# Patient Record
Sex: Male | Born: 1960
Health system: Southern US, Community
[De-identification: ages and names within clinical notes are randomized; demographics above are authoritative.]

## PROBLEM LIST (undated history)

## (undated) DIAGNOSIS — K219 Gastro-esophageal reflux disease without esophagitis: Secondary | ICD-10-CM

## (undated) DIAGNOSIS — C801 Malignant (primary) neoplasm, unspecified: Secondary | ICD-10-CM

## (undated) DIAGNOSIS — E785 Hyperlipidemia, unspecified: Secondary | ICD-10-CM

## (undated) DIAGNOSIS — I10 Essential (primary) hypertension: Secondary | ICD-10-CM

## (undated) DIAGNOSIS — K56609 Unspecified intestinal obstruction, unspecified as to partial versus complete obstruction: Secondary | ICD-10-CM

## (undated) DIAGNOSIS — E119 Type 2 diabetes mellitus without complications: Secondary | ICD-10-CM

## (undated) DIAGNOSIS — U071 COVID-19: Secondary | ICD-10-CM

## (undated) DIAGNOSIS — M7580 Other shoulder lesions, unspecified shoulder: Secondary | ICD-10-CM

## (undated) HISTORY — DX: Unspecified intestinal obstruction, unspecified as to partial versus complete obstruction: K56.609

## (undated) HISTORY — PX: FOOT SURGERY: SHX648

## (undated) HISTORY — PX: HERNIA REPAIR: SHX51

## (undated) HISTORY — DX: Other shoulder lesions, unspecified shoulder: M75.80

## (undated) HISTORY — DX: Gastro-esophageal reflux disease without esophagitis: K21.9

## (undated) HISTORY — PX: OTHER SURGICAL HISTORY: SHX169

## (undated) HISTORY — DX: Hyperlipidemia, unspecified: E78.5

## (undated) HISTORY — PX: TONSILLECTOMY: SUR1361

---

## 2005-11-10 HISTORY — PX: COLONOSCOPY: SHX174

## 2006-09-22 ENCOUNTER — Inpatient Hospital Stay (HOSPITAL_COMMUNITY): Admission: EM | Admit: 2006-09-22 | Discharge: 2006-09-24 | Payer: Self-pay | Admitting: Emergency Medicine

## 2006-09-22 ENCOUNTER — Ambulatory Visit: Payer: Self-pay | Admitting: Internal Medicine

## 2006-10-12 ENCOUNTER — Ambulatory Visit: Payer: Self-pay | Admitting: Internal Medicine

## 2006-10-12 ENCOUNTER — Ambulatory Visit (HOSPITAL_COMMUNITY): Admission: RE | Admit: 2006-10-12 | Discharge: 2006-10-12 | Payer: Self-pay | Admitting: Internal Medicine

## 2006-10-12 HISTORY — PX: GIVENS CAPSULE STUDY: SHX5432

## 2007-05-24 ENCOUNTER — Ambulatory Visit (HOSPITAL_COMMUNITY): Admission: RE | Admit: 2007-05-24 | Discharge: 2007-05-24 | Payer: Self-pay | Admitting: Family Medicine

## 2011-03-28 NOTE — H&P (Signed)
NAMEROLF, FELLS NO.:  [id number]   MEDICAL RECORD NO.:  [id number]          PATIENT TYPE:  INP   LOCATION:  A320                          FACILITY:  [facility]   PHYSICIAN:  Barbaraann Barthel, M.D. DATE OF BIRTH:  1961-10-27   DATE OF ADMISSION:  09/22/2006  DATE OF DISCHARGE:  LH                                HISTORY & PHYSICAL   NOTE:  Surgery was asked to see this 50 year old white male for abdominal  pain.  He was seen in the emergency room, where a CT scan was performed and  revealed partial small-bowel obstruction; Surgery was consulted.   CHIEF COMPLAINT:  Crampy abdominal epigastric pain.   HISTORY OF PRESENT MEDICAL ILLNESS:  The patient states that he was in his  normal state of good health over the weekend.  He developed some pain around  11 o'clock at night on Monday evening; this was after eating some spaghetti  and ice cream.  This was accompanied with bloating, crampy abdominal pain  and bouts of watery stool.  He also had some nausea and vomiting.  He, in  the past, last February and a couple of months ago, also had similar  symptoms which were treated non operatively.  He had a GI consult apparently  at [facility], where he was told that he had Crohn's disease; how  this was worked up I do not know.  He was treated for Crohn's disease with  some steroids and improved somewhat, although the steroids caused him to  have some urinary difficulty.  He states that the episodes that necessitated  his admission now were similar to those types of pain that he had had on 2  previous episodes when he was admitted for partial small-bowel obstruction.   PHYSICAL EXAMINATION:  GENERAL:  Physical examination discloses a pleasant  50 year old white male.  He is 5 feet 11 and weighs 250 pounds.  VITAL SIGNS:  Temperature is 97.6, pulse rate is 93 per minute, blood  pressure 139/106, respirations 20 per minute.  O2 SAT was 96%.  Head:  Normocephalic.  EYES:  Extraocular movements are intact.  Pupils are round and react to  light and accommodation.  The sclerae are of normal tincture.  The patient  uses corrective lenses.  ENT:  Nasal and oral mucosa are moist.  NECK:  Supple and cylindrical without jugular vein distension, thyromegaly  or tracheal deviation.  The patient has no cervical adenopathy and no bruits  are auscultated.  CHEST:  Clear both to anterior and posterior auscultation.  HEART:  Regular rhythm.  ABDOMEN:  The patient is tender primarily in the epigastrium, but I also  note some tenderness in the right upper quadrant.  Bowel sounds are somewhat  diminished.  There are no femoral or inguinal hernias appreciated.  The  patient stated that he had a left inguinal hernia repair during infancy.  There is no recurrent hernia there.  GENITALIA:  Grossly within normal limits.  RECTAL:  Examination was performed in the emergency room by the PA; this was  reported as guaiac-negative.  This was not repeated.  EXTREMITIES:  Grossly within normal limits.  The patient has had previous  bone spur operation on the left foot on his heel.   REVIEW OF SYSTEMS:  GI SYSTEM:  The patient has had 2 previous episodes of  partial small-bowel obstruction.  He has been told that he has had Crohn's  disease and has been treated for this in the past.  He has no past history  of hepatitis.  He has been worked up for gallbladder disease in the past and  this has been negative.  CT scan reveals a partial small-bowel obstruction.  The patient has had nausea and vomiting and multiple bouts of liquid stool  without bright red rectal bleeding or black tarry stools.  GU SYSTEM:  No  dysuria or complaints of frequency.  No past history of nephrolithiasis.  ENDOCRINE SYSTEM:  No past history of diabetes or thyroid disease.  CARDIOVASCULAR SYSTEM:  The patient has a history of hypertension for which  he takes Caduet 10/40 one-half tablet daily and he sees  Dr. Regino Schultze  regarding this.  MUSCULOSKELETAL SYSTEM:  The patient is somewhat obese.   MEDICATIONS:  1. The patient takes Zegerid one tablet daily for GERD.  2. Caduet 10/40 a half tablet daily.   ALLERGIES:  He is allergic to PENICILLIN.   LABORATORY DATA:  The patient was admitted with a white count 22.3 with an  H&H of 17.7 and 52.5 with 94 neutrophils noted.  His electrolytes are  grossly within normal limits.  His blood sugar is 192.  His BUN is 19.  His  creatinine is 1.9.  His lipase is not elevated at 16.  Urinalysis reveals no  proteinuria and no glucosuria.  The specific gravity shows  hyperconcentration.   IMAGING STUDY:  CT scan results as mentioned above.   IMPRESSION:  1. Therefore, Mr. Doughten is admitted with a diagnosis of partial small-bowel      obstruction.  He has a history according to him as worked up by the      Gastroenterology service in [place] as Crohn's disease.  2. He has a history of hypertension.  3. A history of reflux and gastroesophageal reflux disease.   Impression therefore, partial small-bowel obstruction.  This may be due to  an ileus related to Crohn's disease.  He has right upper quadrant pain which  I will review with a sonogram in the morning to rule out gallbladder  disease.  He is dehydrated and hemoconcentrated at present.  We will continue to  hydrate him.  I will have Dr. Edison Simon service follow him medically and  also obtain a Gastroenterology consult as well.      Barbaraann Barthel, M.D.  Electronically Signed     WB/MEDQ  D:  09/22/2006  T:  09/23/2006  Job:  [id number]   cc:   Kirk Ruths, M.D.  Fax: [phone]

## 2011-03-28 NOTE — Consult Note (Signed)
[name], [name]                 ACCOUNT NO.:  [id number]   MEDICAL RECORD NO.:  [id number]          PATIENT TYPE:  INP   LOCATION:  A320                          FACILITY:  [facility]   PHYSICIAN:  R. Roetta Sessions, M.D. DATE OF BIRTH:  [birth year]   DATE OF CONSULTATION:  09/23/2006  DATE OF DISCHARGE:                                   CONSULTATION   REASON FOR CONSULTATION:  1. Partial small bowel obstruction.  2. Questionable history of Crohn's disease.   Physician requesting consultation, Barbaraann Barthel, M.D.  Physician cosigning note, Roetta Sessions.   HISTORY OF PRESENT ILLNESS:  The patient is a 50 year old Caucasian  gentleman who was admitted with partial small bowel obstruction.  This is  his third episode since February of this year.  He has been hospitalized on  2 other occasions in [place], [place].  In April, he had a CT, which  suggested partial small bowel obstruction and questionable abnormal terminal  ileum, possibly Crohn's disease.  He underwent colonoscopy with several cm  of the terminal ileum seen by Dr. __________.  He had a sigmoid colon polyp  and terminal ileum lymphoid hypoplasia but otherwise, no evidence of Crohn's  disease.  I have not seen a biopsy report.  He had a small bowel follow  through, which was normal.  He said he was treated with Entocort for several  months but, per the patient, was told that they are not sure if he actually  has Crohn's disease.  The patient feels well in between these episodes.  He  has regular bowel movements, no diarrhea, melena or rectal bleeding, nausea  or vomiting or abdominal pain.  He has chronic GERD, which is well  controlled.  He woke up in the middle of the night with severe abdominal  pain.  He describes abdominal bloating, cramping, vomiting and diarrhea.  Denies any fever and chills.   MEDICATIONS AT HOME:  1. Zegrid 40 mg daily.  2. Caduet 10/40 mg 1/2 tablet daily.  He rarely takes ibuprofen.   ALLERGIES:  PENICILLIN.   PAST MEDICAL HISTORY:  1. History of recurrent partial small bowel obstruction as outlined above.  2. Gastroesophageal reflux disease.  3. Hypertension.  4. Hyperlipidemia.  5. He has had left foot bone spurs.   PAST SURGICAL HISTORY:  1. Tonsillectomy.  2. Left inguinal hernia repair as a child.   FAMILY HISTORY:  Negative for Crohn's and GI illnesses, colorectal cancer,  IBD.   SOCIAL HISTORY:  He is married and has 3 children.  He works for [employer].  No tobacco or alcohol use.   REVIEW OF SYSTEMS:  See HPI for GI.  CONSTITUTIONAL:  No weight loss.  CARDIOPULMONARY:  No chest pain or  shortness of breath.   PHYSICAL EXAMINATION:  VITAL SIGNS:  Temperature: 98.6.  Pulse:  88.  Respirations:  20.  Blood pressure:  133/79.  Weight:  113.2 kg.  Height:  71 inches.  GENERAL:  Pleasant, obese Caucasian male in no acute distress.  SKIN:  Warm and dry, no jaundice.  HEENT:  Sclerae nonicteric.  __________ and moist and pink.  NG tube in  place with clear, yellow secretions.  CHEST:  Clear to auscultation.  CARDIAC:  Regular rate and rhythm.  Normal S1, S2.  No murmurs, rubs or  gallops.  ABDOMEN:  Positive bowel sounds.  Soft, nontender, nondistended.  No  organomegaly or masses.  No rebound, tenderness or guarding.  EXTREMITIES:  No edema.   LABS:  Initially his white count was 22,300, it is 14,200 today, hemoglobin  14.6, hematocrit 43.9, platelets 380,000, sodium 138, potassium 3.7, BUN 27,  creatinine 1.9, down to 1.2 today, glucose on admission was 192, it is 129  today, lipase is 16.  CT without contrast revealed dilated fluid filled  loops of small bowel.   IMPRESSION:  Recurrent partial small bowel obstruction, unclear etiology.  The patient apparently on previous CT, had questionable inflammatory changes  at the terminal ileum and there was questionable Crohn's disease but  reportedly, endoscopically, his TI looks normal and small  bowel follow  through has subsequently been normal.   RECOMMENDATIONS:  Will discuss further with Dr. Cameron Ali regarding any further  workup.  Follow up on abdominal ultrasound when available.  Add LFTs to  labs.      Tana Coast, P.AJonathon Bellows, M.D.  Electronically Signed    LL/MEDQ  D:  09/23/2006  T:  09/23/2006  Job:  [id number]   cc:   Barbaraann Barthel, M.D.  Fax: [phone]

## 2011-03-28 NOTE — Op Note (Signed)
[name], [name]                 ACCOUNT NO.:  [id number]   MEDICAL RECORD NO.:  [id number]          PATIENT TYPE:  AMB   LOCATION:  DAY                           FACILITY:  [facility]   PHYSICIAN:  R. Roetta Sessions, M.D. DATE OF BIRTH:  [birth year]   DATE OF PROCEDURE:  DATE OF DISCHARGE:  10/12/2006                               OPERATIVE REPORT   PROCEDURE:  Small bowel Givens capsule endoscopy.   PHYSICIAN REQUESTING PROCEDURE:  Jonathon Bellows, M.D.   INDICATIONS FOR PROCEDURE:  The patient is a 50 year old Caucasian male  who was admitted with an episode of acute abdominal pain and was found  to have a partial small bowel obstruction diagnosed by CT scan back on  September 22, 2006. A previous CT had shown questionable inflammatory  changes at the terminal ileum with questionable Crohn's disease but  endoscopically his terminal ileum looks normal and small bowel  followthrough had subsequently been normal. On colonoscopy and terminal  ileoscopy, he was found to have terminal ileal lymphoid hyperplasia but  otherwise there was no evidence of Crohn's disease. He is undergoing  Givens capsule study for further evaluation of his small bowel.   FINDINGS:  Gastric passage time was 9 minutes with first duodenal image  being at 9 minutes and 29 seconds. At 5 hours 23 minutes and 7 seconds,  he was found to have lymphoid hyperplasia which persisted throughout the  remainder of the small bowel to the terminal ileum at 5 hours 40  minutes. The first ileocecal image was at 5 hours 40 minutes and 52  seconds. The first cecal image was at 5 hours 51 minutes and 30 seconds.  Total small bowel transit time was 5 hours 42 minutes. There was no  evidence of active GI bleeding, inflammation or erosions on this study.   RECOMMENDATIONS:  Lymphoid hyperplasia of the distal small  bowel/terminal ileum. More than like this is what was seen on CT scan as  it has been confirmed with both small bowel  capsule study as well as  previous colonoscopy. There was no evidence of inflammatory bowel  disease. Lymphoid hyperplasia is a benign finding and no further workup  is required at this time.      Nicholas Lose, N.P.      Jonathon Bellows, M.D.  Electronically Signed    KC/MEDQ  D:  10/20/2006  T:  10/20/2006  Job:  [id number]   cc:   Kirk Ruths, M.D.  Fax: [phone]

## 2011-03-28 NOTE — Discharge Summary (Signed)
[name], [name] NO.:  [id number]   MEDICAL RECORD NO.:  [id number]          PATIENT TYPE:  INP   LOCATION:  A320                          FACILITY:  [facility]   PHYSICIAN:  Barbaraann Barthel, M.D. DATE OF BIRTH:  1961-01-08   DATE OF ADMISSION:  09/22/2006  DATE OF DISCHARGE:  11/15/2007LH                                 DISCHARGE SUMMARY   CONSULTATIONS:  1. To Dr. Regino Schultze.  2. To Dr. Jena Gauss of the GI service.   ADMISSION DIAGNOSES:  1. Partial small bowel obstruction.  2. History of Crohn's disease.   SECONDARY DIAGNOSES:  1. Hypertension.  2. History of gastroesophageal reflux.   NOTE:  This is a 50 year old white male who was admitted with an episode of  acute abdominal pain which began about 4 or 5 hours after eating spaghetti  and an ice cream, and he developed bloating.  He was admitted to the  emergency room, and a partial small bowel obstruction was diagnosed on CT  scan, and surgery was consulted.  In essence, an NG tube was placed, and he  immediately decompressed. He was passing flatus and having stools, after 24  hours after his NG tube was removed.  He had had no previous history of any  intra-abdominal surgery.  He had two prior episodes of similar circumstances  that came acutely after eating.  However, no intermittent episodes of  abdominal postprandial pain suggestive of ischemia.  The diagnosis of  Crohn's  disease is somewhat in doubt, as apparently had a normal small-  bowel follow-through at one point in the workup, and certainly nothing like  that was seen on CT scan.  I hypothesized that there is possibly some  dietary intolerance or allergy or some problems that are of that nature  rather than a mechanical problem due to Crohn's disease.  At any rate, the  gastroenterology service was consulted, and they essentially agreed that he  had no surgical problem as well, and the plan is for further GI workup as an  outpatient. where a  capsular study of the small intestine as planned.   His hospital course was completely unremarkable.  His blood pressures, etc.,  all remained within normal limits.  He remained afebrile and, as stated, he  improved immediately after a small time with NG decompression.   LABORATORY DATA:  The patient was admitted with a white count of 22.3 with  an H&H of 17.7 and 52.5, with 94 neutrophils. After hydration, his white  count was 14,000 with H&H of 14.6 and 43.9, with a normal differential.  His  electrolytes remained within normal limits.  His lipase was not elevated on  admission.  Also, his liver function studies all were grossly within normal  limits.  While admitted, since he had some tenderness in the right upper  quadrant, I did check a sonogram of his right upper quadrant, and this also  revealed no stones or any suggestion of gallbladder disease.   DISCHARGE INSTRUCTIONS:  The patient is discharged on a bland diet.  He is  told to refrain  from any milk products.  He is to follow up with Dr. Jena Gauss  for their planned outpatient workup.  He is to continue taking his  hypertensive medications and other medications as per Dr. Regino Schultze, and I  have made arrangements to follow up with him, and he is to return to the  emergency room should there be any acute problems.  The patient is  discharged on a bland diet.  His activity is to increase as tolerated.  Medications as stated above, to continue his hypertensive medications and  his acid reflux medications.  I will see him in three weeks' time.      Barbaraann Barthel, M.D.  Electronically Signed     WB/MEDQ  D:  09/24/2006  T:  09/24/2006  Job:  [id number]   cc:   [name], M.D.  Fax: [phone]   [name], M.D.  [address]  [place]  [place] [address]

## 2016-02-25 ENCOUNTER — Inpatient Hospital Stay (HOSPITAL_COMMUNITY)
Admission: EM | Admit: 2016-02-25 | Discharge: 2016-02-28 | DRG: 390 | Disposition: A | Discharge status: Home / Self Care | Payer: Commercial Managed Care - PPO | Attending: Internal Medicine | Admitting: Internal Medicine

## 2016-02-25 ENCOUNTER — Encounter (HOSPITAL_COMMUNITY): Payer: Self-pay | Admitting: Emergency Medicine

## 2016-02-25 ENCOUNTER — Emergency Department (HOSPITAL_COMMUNITY): Payer: Commercial Managed Care - PPO

## 2016-02-25 DIAGNOSIS — I10 Essential (primary) hypertension: Secondary | ICD-10-CM | POA: Diagnosis present

## 2016-02-25 DIAGNOSIS — K869 Disease of pancreas, unspecified: Secondary | ICD-10-CM | POA: Diagnosis present

## 2016-02-25 DIAGNOSIS — Z7984 Long term (current) use of oral hypoglycemic drugs: Secondary | ICD-10-CM | POA: Diagnosis not present

## 2016-02-25 DIAGNOSIS — Z823 Family history of stroke: Secondary | ICD-10-CM

## 2016-02-25 DIAGNOSIS — K567 Ileus, unspecified: Secondary | ICD-10-CM | POA: Diagnosis not present

## 2016-02-25 DIAGNOSIS — Z833 Family history of diabetes mellitus: Secondary | ICD-10-CM | POA: Diagnosis not present

## 2016-02-25 DIAGNOSIS — R112 Nausea with vomiting, unspecified: Secondary | ICD-10-CM

## 2016-02-25 DIAGNOSIS — K8689 Other specified diseases of pancreas: Secondary | ICD-10-CM | POA: Diagnosis present

## 2016-02-25 DIAGNOSIS — K560 Paralytic ileus: Principal | ICD-10-CM | POA: Diagnosis present

## 2016-02-25 DIAGNOSIS — Z7982 Long term (current) use of aspirin: Secondary | ICD-10-CM

## 2016-02-25 DIAGNOSIS — F17220 Nicotine dependence, chewing tobacco, uncomplicated: Secondary | ICD-10-CM | POA: Diagnosis present

## 2016-02-25 DIAGNOSIS — Z8249 Family history of ischemic heart disease and other diseases of the circulatory system: Secondary | ICD-10-CM

## 2016-02-25 DIAGNOSIS — E1165 Type 2 diabetes mellitus with hyperglycemia: Secondary | ICD-10-CM | POA: Diagnosis present

## 2016-02-25 DIAGNOSIS — R1110 Vomiting, unspecified: Secondary | ICD-10-CM | POA: Diagnosis not present

## 2016-02-25 DIAGNOSIS — E860 Dehydration: Secondary | ICD-10-CM | POA: Diagnosis present

## 2016-02-25 HISTORY — DX: Type 2 diabetes mellitus without complications: E11.9

## 2016-02-25 HISTORY — DX: Essential (primary) hypertension: I10

## 2016-02-25 LAB — CBC WITH DIFFERENTIAL/PLATELET
BASOS ABS: 0 10*3/uL (ref 0.0–0.1)
Basophils Relative: 0 %
EOS PCT: 0 %
Eosinophils Absolute: 0 10*3/uL (ref 0.0–0.7)
HEMATOCRIT: 47.5 % (ref 39.0–52.0)
Hemoglobin: 15.4 g/dL (ref 13.0–17.0)
LYMPHS ABS: 0.9 10*3/uL (ref 0.7–4.0)
LYMPHS PCT: 6 %
MCH: 27.8 pg (ref 26.0–34.0)
MCHC: 32.4 g/dL (ref 30.0–36.0)
MCV: 85.9 fL (ref 78.0–100.0)
MONO ABS: 1.6 10*3/uL — AB (ref 0.1–1.0)
Monocytes Relative: 10 %
NEUTROS ABS: 12.9 10*3/uL — AB (ref 1.7–7.7)
Neutrophils Relative %: 84 %
Platelets: 316 10*3/uL (ref 150–400)
RBC: 5.53 MIL/uL (ref 4.22–5.81)
RDW: 13 % (ref 11.5–15.5)
WBC: 15.4 10*3/uL — AB (ref 4.0–10.5)

## 2016-02-25 LAB — COMPREHENSIVE METABOLIC PANEL
ALBUMIN: 4.2 g/dL (ref 3.5–5.0)
ALT: 36 U/L (ref 17–63)
ANION GAP: 16 — AB (ref 5–15)
AST: 28 U/L (ref 15–41)
Alkaline Phosphatase: 56 U/L (ref 38–126)
BUN: 31 mg/dL — AB (ref 6–20)
CHLORIDE: 97 mmol/L — AB (ref 101–111)
CO2: 23 mmol/L (ref 22–32)
Calcium: 8.4 mg/dL — ABNORMAL LOW (ref 8.9–10.3)
Creatinine, Ser: 1.25 mg/dL — ABNORMAL HIGH (ref 0.61–1.24)
GFR calc Af Amer: >60 mL/min (ref >60)
GFR calc non Af Amer: >60 mL/min (ref >60)
GLUCOSE: 329 mg/dL — AB (ref 65–99)
POTASSIUM: 3.5 mmol/L (ref 3.5–5.1)
Sodium: 136 mmol/L (ref 135–145)
Total Bilirubin: 1.3 mg/dL — ABNORMAL HIGH (ref 0.3–1.2)
Total Protein: 8.1 g/dL (ref 6.5–8.1)

## 2016-02-25 LAB — GLUCOSE, CAPILLARY
Glucose-Capillary: 202 mg/dL — ABNORMAL HIGH (ref 65–99)
Glucose-Capillary: 212 mg/dL — ABNORMAL HIGH (ref 65–99)

## 2016-02-25 LAB — URINE MICROSCOPIC-ADD ON: WBC UA: NONE SEEN WBC/hpf (ref 0–5)

## 2016-02-25 LAB — LIPASE, BLOOD: Lipase: 17 U/L (ref 11–51)

## 2016-02-25 LAB — URINALYSIS, ROUTINE W REFLEX MICROSCOPIC
Glucose, UA: 500 mg/dL — AB
Hgb urine dipstick: NEGATIVE
KETONES UR: 15 mg/dL — AB
LEUKOCYTES UA: NEGATIVE
NITRITE: NEGATIVE
PH: 5.5 (ref 5.0–8.0)
Protein, ur: 30 mg/dL — AB

## 2016-02-25 MED ORDER — METOCLOPRAMIDE HCL 5 MG/ML IJ SOLN
10.0000 mg | Freq: Once | INTRAMUSCULAR | Status: AC
  Administered 2016-02-25: 10 mg via INTRAVENOUS
  Filled 2016-02-25: qty 2

## 2016-02-25 MED ORDER — SODIUM CHLORIDE 0.9 % IV BOLUS (SEPSIS)
1000.0000 mL | Freq: Once | INTRAVENOUS | Status: AC
  Administered 2016-02-25: 1000 mL via INTRAVENOUS

## 2016-02-25 MED ORDER — IOPAMIDOL (ISOVUE-300) INJECTION 61%
100.0000 mL | Freq: Once | INTRAVENOUS | Status: AC | PRN
  Administered 2016-02-25: 100 mL via INTRAVENOUS

## 2016-02-25 MED ORDER — ACETAMINOPHEN 325 MG PO TABS: 650.0000 mg | ORAL_TABLET | Freq: Four times a day (QID) | ORAL | Status: DC | PRN

## 2016-02-25 MED ORDER — MORPHINE SULFATE (PF) 4 MG/ML IV SOLN
4.0000 mg | Freq: Once | INTRAVENOUS | Status: AC
  Administered 2016-02-25: 4 mg via INTRAVENOUS
  Filled 2016-02-25: qty 1

## 2016-02-25 MED ORDER — PROMETHAZINE HCL 25 MG RE SUPP
25.0000 mg | Freq: Four times a day (QID) | RECTAL | Status: DC | PRN
  Administered 2016-02-25: 25 mg via RECTAL
  Filled 2016-02-25: qty 1

## 2016-02-25 MED ORDER — ONDANSETRON HCL 4 MG/2ML IJ SOLN
4.0000 mg | Freq: Once | INTRAMUSCULAR | Status: AC
  Administered 2016-02-25: 4 mg via INTRAVENOUS
  Filled 2016-02-25: qty 2

## 2016-02-25 MED ORDER — ACETAMINOPHEN 650 MG RE SUPP: 650.0000 mg | Freq: Four times a day (QID) | RECTAL | Status: DC | PRN

## 2016-02-25 MED ORDER — MORPHINE SULFATE (PF) 2 MG/ML IV SOLN
2.0000 mg | INTRAVENOUS | Status: DC | PRN
  Administered 2016-02-25 – 2016-02-26 (×2): 2 mg via INTRAVENOUS
  Filled 2016-02-25 (×2): qty 1

## 2016-02-25 MED ORDER — MORPHINE SULFATE (PF) 4 MG/ML IV SOLN
4.0000 mg | Freq: Once | INTRAVENOUS | Status: AC
Start: 2016-02-25 — End: 2016-02-25
  Administered 2016-02-25: 4 mg via INTRAVENOUS
  Filled 2016-02-25: qty 1

## 2016-02-25 MED ORDER — SODIUM CHLORIDE 0.9 % IV SOLN
INTRAVENOUS | Status: AC
  Administered 2016-02-25: 16:00:00 via INTRAVENOUS

## 2016-02-25 MED ORDER — SODIUM CHLORIDE 0.9 % IV SOLN
INTRAVENOUS | Status: DC
  Administered 2016-02-25 – 2016-02-26 (×4): via INTRAVENOUS

## 2016-02-25 MED ORDER — DIATRIZOATE MEGLUMINE & SODIUM 66-10 % PO SOLN
ORAL | Status: AC
  Administered 2016-02-25: 15 mL
  Filled 2016-02-25: qty 30

## 2016-02-25 MED ORDER — INSULIN ASPART 100 UNIT/ML ~~LOC~~ SOLN
0.0000 [IU] | Freq: Four times a day (QID) | SUBCUTANEOUS | Status: DC
  Administered 2016-02-25: 3 [IU] via SUBCUTANEOUS
  Administered 2016-02-26: 2 [IU] via SUBCUTANEOUS
  Administered 2016-02-26: 3 [IU] via SUBCUTANEOUS
  Administered 2016-02-27 (×3): 1 [IU] via SUBCUTANEOUS
  Administered 2016-02-27 – 2016-02-28 (×4): 2 [IU] via SUBCUTANEOUS

## 2016-02-25 NOTE — Progress Notes (Signed)
Notified NP, pt was requesting ice chips. Awaiting response.

## 2016-02-25 NOTE — ED Provider Notes (Signed)
Prior hx of SBO many years ago for no known reason Has has 2 days of increased abd swelling, n/v On exam is tympanitic tou percussion - CT shows pnacreatic tail mass and SBO likely D/w pt - Needs NG tube and admission  Medical screening examination/treatment/procedure(s) were conducted as a shared visit with non-physician practitioner(s) and myself.  I personally evaluated the patient during the encounter.  Clinical Impression:   Final diagnoses:  Pancreatic mass  Non-intractable vomiting with nausea, vomiting of unspecified type         Noemi Chapel, MD 02/27/16 1408

## 2016-02-25 NOTE — ED Notes (Signed)
Started vomiting about 2 pm yesterday.  Vomited greater than 10 times in last 24 hours.  Rates  Abdominal pain 7/10 and increases at times to 9/10.

## 2016-02-25 NOTE — H&P (Signed)
History and Physical  [name] [id number] DOB: 09-Jan-1961 DOA: 02/25/2016  Referring physician: Noemi Chapel PCP: Glo Herring., MD  Patient coming from: Home  Chief Complaint: emesis  HPI:  55 year old man presented with sudden onset of nausea and vomiting with associated abdominal pain. Initial evaluation in imaging suggested ileus versus small bowel obstruction.  Had sudden onset of nausea 4/16 around 2 PM, followed by vomiting. One hour after his vomiting began, he developed diffuse abd pain that he described as dull and persistent. Since then, he has had multiple other episodes of vomiting and has been unable to keep down food or liquids. He also notes that he feels "gassy and bloated". The patient's spouse reports having had diarrhea yesterday and other family member experienced similar episodes last week. He denies any alleviating or aggravating factors other than relief with NG tube. No previous history of abdominal surgery. No history of GI illness.   Of note this is his third episode of very similar symptoms. In 2007 he was hospitalized within the system and CT that time showed partial small bowel obstruction. He also was hospitalized previous to this in [place] and underwent extensive investigation including small bowel capsule study and small bowel follow-through all of which were normal. His previous 2 episodes did not involve gastroenteritis type symptoms.  In the emergency department: afebrile, VSS Pertinent labs: CMP unremarkable, WBC 15.4. U/a negative. Imaging: CT abd pelvis showed a pancreatic tail mass, small bowel dilatation.  Review of Systems:  Positive for: Generalize muscle aches Negative for fever, visual changes, sore throat, rash, chest pain, SOB, dysuria, bleeding,   Past Medical History  Diagnosis Date  . Hypertension   . Diabetes mellitus without complication [facility])     Past Surgical History  Procedure Laterality Date  . Tonsillectomy    .  Foot surgery       reports that he has never smoked. His smokeless tobacco use includes Chew. He reports that he drinks about 0.6 oz of alcohol per week. He reports that he does not use illicit drugs.  Allergies  Allergen Reactions  . Penicillins Rash    Has patient had a PCN reaction causing immediate rash, facial/tongue/throat swelling, SOB or lightheadedness with hypotension: Yes Has patient had a PCN reaction causing severe rash involving mucus membranes or skin necrosis: No Has patient had a PCN reaction that required hospitalization No Has patient had a PCN reaction occurring within the last 10 years: No If all of the above answers are "NO", then may proceed with Cephalosporin use.     Family History  Problem Relation Age of Onset  . Stroke Mother   . Hypertension Mother   . Diabetes Mother   . CAD Mother      Prior to Admission medications   Medication Sig Start Date End Date Taking? Authorizing Provider  amLODipine-valsartan (EXFORGE) 10-160 MG tablet Take 1 tablet by mouth daily.   Yes Historical Provider, MD  aspirin EC 81 MG tablet Take 81 mg by mouth daily.   Yes Historical Provider, MD  hydrochlorothiazide (HYDRODIURIL) 25 MG tablet Take 25 mg by mouth daily.   Yes Historical Provider, MD  lansoprazole (PREVACID) 15 MG capsule Take 15 mg by mouth daily at 12 noon.   Yes Historical Provider, MD  metFORMIN (GLUCOPHAGE) 500 MG tablet Take 500 mg by mouth daily with breakfast.   Yes Historical Provider, MD  simvastatin (ZOCOR) 20 MG tablet Take 20 mg by mouth daily.   Yes Historical Provider, MD  Physical Exam: Filed Vitals:   02/25/16 1030 02/25/16 1100 02/25/16 1130 02/25/16 1455  BP: 150/88 139/72 151/79 169/86  Pulse: 91 93 97 84  Temp:    98.1 F (36.7 C)  TempSrc:    Oral  Resp:      Height:    5\' 10"  (1.778 m)  Weight:      SpO2:  91% 92% 96%     Constitutional:  . Appears calm and only mildly uncomfortable Eyes:  . PERRL and irises appear  normal . Normal conjunctivae and lids ENMT:  . external ears, nose appear normal . grossly normal hearing  . Lips appear normal  . Oropharynx: mucosa, tongue appear normal Neck:  . neck appears normal, no masses, normal ROM  . no thyromegaly Respiratory:  . CTA bilaterally, no w/r/r.  . Respiratory effort normal. No retractions or accessory muscle use Cardiovascular:  . RRR, no m/r/g . No LE extremity edema   Abdomen:  . Abdomen appears distended. Mild generalized tenderness predominantly upper left side. Positive bowel sounds. . No hernias noted. Musculoskeletal:  . Digits/nails: no clubbing, cyanosis, petechiae, infection . RUE, LUE, RLE, LLE   o strength and tone normal, no atrophy, no abnormal movements o No tenderness, masses Skin:  . No rashes, lesions, ulcers noted . palpation of skin: no induration or nodules Neurologic:  . Appears grossly normal Psychiatric:  . judgement and insight appear normal . Mental status o Mood, affect appropriate  Wt Readings from Last 3 Encounters:  02/25/16 112.038 kg (247 lb)    I have personally reviewed following labs and imaging studies  Labs on Admission:  CBC:  Recent Labs Lab 02/25/16 0920  WBC 15.4*  NEUTROABS 12.9*  HGB 15.4  HCT 47.5  MCV 85.9  PLT 123XX123   Basic Metabolic Panel:  Recent Labs Lab 02/25/16 0920  NA 136  K 3.5  CL 97*  CO2 23  GLUCOSE 329*  BUN 31*  CREATININE 1.25*  CALCIUM 8.4*   Liver Function Tests:  Recent Labs Lab 02/25/16 0920  AST 28  ALT 36  ALKPHOS 56  BILITOT 1.3*  PROT 8.1  ALBUMIN 4.2    Recent Labs Lab 02/25/16 0920  LIPASE 17   Urine analysis:    Component Value Date/Time   COLORURINE YELLOW 02/25/2016 0900   APPEARANCEUR CLEAR 02/25/2016 0900   LABSPEC >1.030* 02/25/2016 0900   PHURINE 5.5 02/25/2016 0900   GLUCOSEU 500* 02/25/2016 0900   HGBUR NEGATIVE 02/25/2016 0900   BILIRUBINUR SMALL* 02/25/2016 0900   KETONESUR 15* 02/25/2016 0900   PROTEINUR  30* 02/25/2016 0900   NITRITE NEGATIVE 02/25/2016 0900   LEUKOCYTESUR NEGATIVE 02/25/2016 0900     Radiological Exams on Admission: Ct Abdomen Pelvis W Contrast  02/25/2016  CLINICAL DATA:  Abdominal pain for 1 day with vomiting EXAM: CT ABDOMEN AND PELVIS WITH CONTRAST TECHNIQUE: Multidetector CT imaging of the abdomen and pelvis was performed using the standard protocol following bolus administration of intravenous contrast. CONTRAST:  132mL ISOVUE-300 IOPAMIDOL (ISOVUE-300) INJECTION 61% COMPARISON:  09/22/2006 FINDINGS: Lung bases are free of acute infiltrate or sizable effusion. The liver is diffusely fatty infiltrated without focal mass lesion. A tiny hypodensity is noted in the right lobe consistent with a small cyst. The gallbladder, spleen, left adrenal gland and kidneys are within normal limits. Fullness in the right adrenal gland is noted consistent with small adenoma. This is stable from the prior exam. The pancreas is well visualized and arising from the tail of  the pancreas there is a peripherally enhancing soft tissue mass lesion which measures approximately 3.3 x 3.1 cm. A central calcification is noted within. Stomach is significantly distended with ingested contrast material. Multiple loops of dilated small bowel are identified in the mid to distal jejunum and proximal ileum. No definitive discrete transition zone is identified. No mass lesion is seen. The bladder is well distended. No pelvic mass lesion is seen. No significant lymphadenopathy is noted. The osseous structures are within normal limits. IMPRESSION: Pancreatic tail mass as described above. Further workup is recommended. MRI may be helpful in this regard. Additionally this lesion would be amenable percutaneous biopsy. Small bowel dilatation without definitive mass lesion. This may represent a mild small bowel ileus or partial small bowel obstruction. Clinical correlation with physical exam is recommended. Stable right adrenal  adenoma. Electronically Signed   By: Inez Catalina M.D.   On: 02/25/2016 12:40     Principal Problem:   Ileus ([place]) Active Problems:   Pancreatic mass   Nausea with vomiting   Dehydration   Assessment/Plan 1. Vomiting with abdominal pain and small bowel dilatation. Multiple sick contacts. Favor ileus rather than PSBO. Does have h/o PSBO 2007. On toxic, hemodynamics stable. No evidence of infection. 2. Dehydration  3. Pancreatitic tail mass, amenable to percutaneous biopsy. MRI in near future to further evaluate. 4. DM type 2 with hyperglycemia.   Admit to medical bed  IVF, antiemetics, analgesia, supportive care. General surgery consultation the morning.  BMP in AM  MRI prior to discharge  DVT prophylaxis: SCDs Code Status: Full Family Communication: Discussed with patient  Admission status: admit to medical bed   Time spent: 55 minutes  Murray Hodgkins, MD  [facility] Direct contact:  --Via [place]  --[email or web address]; password [email or web address] and click  [email or web address] contact night coverage as above  02/25/2016, 5:02 PM

## 2016-02-25 NOTE — ED Provider Notes (Signed)
CSN: [id number]     Arrival date & time 02/25/16  [id number] History   First MD Initiated Contact with Patient 02/25/16 [phone]     Chief Complaint  Patient presents with  . Emesis     (Consider location/radiation/quality/duration/timing/severity/associated sxs/prior Treatment) HPI   [name] is a 55 y.o. male who presents to the Emergency Department complaining of sudden onset of vomiting at approximately 2 PM yesterday. Developed diffuse abdominal pain one hour later. He describes the pain as dull and persistent. He also notes that he feels "gassy and bloated"  he reports multiple episodes of vomiting and unable to keep down food or fluids. Mild pain to lower back.  Patient's spouse reports having diarrhea yesterday and another family member with similar episodes last week.  Nothing makes his symptoms better or worse. He denies black or bloody stools or vomitus, chest pain, shortness of breath, previous abdominal surgeries, dysuria, fever or chills.   Past Medical History  Diagnosis Date  . Hypertension   . Diabetes mellitus without complication [facility])    Past Surgical History  Procedure Laterality Date  . Tonsillectomy    . Foot surgery     No family history on file. Social History  Substance Use Topics  . Smoking status: Never Smoker   . Smokeless tobacco: Current User    Types: Chew  . Alcohol Use: 0.6 oz/week    1 Cans of beer per week    Review of Systems  Constitutional: Negative for fever, chills and appetite change.  HENT: Negative for sore throat.   Respiratory: Negative for cough, chest tightness and shortness of breath.   Cardiovascular: Negative for chest pain.  Gastrointestinal: Positive for nausea, vomiting and abdominal pain. Negative for diarrhea, constipation and blood in stool.  Genitourinary: Negative for dysuria, flank pain, decreased urine volume and difficulty urinating.  Musculoskeletal: Negative for back pain.  Skin: Negative for color change and rash.   Neurological: Negative for dizziness, weakness and numbness.  Hematological: Negative for adenopathy.  All other systems reviewed and are negative.     Allergies  Penicillins  Home Medications   Prior to Admission medications   Not on File   BP 141/92 mmHg  Pulse 103  Temp(Src) 97.9 F (36.6 C) (Oral)  Resp 18  Ht 5\' 10"  (1.778 m)  Wt 112.038 kg  BMI 35.44 kg/m2  SpO2 96% Physical Exam  Constitutional: He is oriented to person, place, and time. He appears well-developed and well-nourished. No distress.  HENT:  Head: Normocephalic and atraumatic.  Mouth/Throat: Uvula is midline and oropharynx is clear and moist. Mucous membranes are dry.  Neck: Normal range of motion. Neck supple. No JVD present.  Cardiovascular: Normal rate, regular rhythm and intact distal pulses.   No murmur heard. Pulmonary/Chest: Effort normal and breath sounds normal. No respiratory distress.  Abdominal: Soft. Normal appearance and bowel sounds are normal. He exhibits distension. He exhibits no mass. There is no hepatosplenomegaly. There is generalized tenderness. There is no rebound, no guarding and no CVA tenderness.    Musculoskeletal: Normal range of motion. He exhibits no edema.  Lymphadenopathy:    He has no cervical adenopathy.  Neurological: He is alert and oriented to person, place, and time. He exhibits normal muscle tone. Coordination normal.  Skin: Skin is warm and dry.  Psychiatric: He has a normal mood and affect.  Nursing note and vitals reviewed.   ED Course  Procedures (including critical care time) Labs Review Labs Reviewed  CBC  WITH DIFFERENTIAL/PLATELET - Abnormal; Notable for the following:    WBC 15.4 (*)    Neutro Abs 12.9 (*)    Monocytes Absolute 1.6 (*)    All other components within normal limits  URINALYSIS, ROUTINE W REFLEX MICROSCOPIC (NOT AT [facility]) - Abnormal; Notable for the following:    Specific Gravity, Urine >1.030 (*)    Glucose, UA 500 (*)     Bilirubin Urine SMALL (*)    Ketones, ur 15 (*)    Protein, ur 30 (*)    All other components within normal limits  URINE MICROSCOPIC-ADD ON - Abnormal; Notable for the following:    Squamous Epithelial / LPF 0-5 (*)    Bacteria, UA FEW (*)    Casts GRANULAR CAST (*)    All other components within normal limits  COMPREHENSIVE METABOLIC PANEL  LIPASE, BLOOD    Imaging Review Ct Abdomen Pelvis W Contrast  02/25/2016  CLINICAL DATA:  Abdominal pain for 1 day with vomiting EXAM: CT ABDOMEN AND PELVIS WITH CONTRAST TECHNIQUE: Multidetector CT imaging of the abdomen and pelvis was performed using the standard protocol following bolus administration of intravenous contrast. CONTRAST:  169mL ISOVUE-300 IOPAMIDOL (ISOVUE-300) INJECTION 61% COMPARISON:  09/22/2006 FINDINGS: Lung bases are free of acute infiltrate or sizable effusion. The liver is diffusely fatty infiltrated without focal mass lesion. A tiny hypodensity is noted in the right lobe consistent with a small cyst. The gallbladder, spleen, left adrenal gland and kidneys are within normal limits. Fullness in the right adrenal gland is noted consistent with small adenoma. This is stable from the prior exam. The pancreas is well visualized and arising from the tail of the pancreas there is a peripherally enhancing soft tissue mass lesion which measures approximately 3.3 x 3.1 cm. A central calcification is noted within. Stomach is significantly distended with ingested contrast material. Multiple loops of dilated small bowel are identified in the mid to distal jejunum and proximal ileum. No definitive discrete transition zone is identified. No mass lesion is seen. The bladder is well distended. No pelvic mass lesion is seen. No significant lymphadenopathy is noted. The osseous structures are within normal limits. IMPRESSION: Pancreatic tail mass as described above. Further workup is recommended. MRI may be helpful in this regard. Additionally this lesion  would be amenable percutaneous biopsy. Small bowel dilatation without definitive mass lesion. This may represent a mild small bowel ileus or partial small bowel obstruction. Clinical correlation with physical exam is recommended. Stable right adrenal adenoma. Electronically Signed   By: Inez Catalina M.D.   On: 02/25/2016 12:40   I have personally reviewed and evaluated these images and lab results as part of my medical decision-making.   EKG Interpretation None      MDM   Final diagnoses:  Pancreatic mass  Non-intractable vomiting with nausea, vomiting of unspecified type    Patient is well-appearing. Vital signs are stable. Nontoxic.  Has received IVF's, anti-emetics and morphine for pain.  Labs and CT scan reviewed and discussed with Dr. Sabra Heck who has also seen the patient.  Will consult hospitalist for admission. NG tube ordered   68  Consulted Dr. Sarajane Jews, [place] hospitalist, and discussed findings, agrees to admit.    Kem Parkinson, PA-C 02/25/16 [place], MD 02/27/16 1407

## 2016-02-26 ENCOUNTER — Inpatient Hospital Stay (HOSPITAL_COMMUNITY): Payer: Commercial Managed Care - PPO

## 2016-02-26 DIAGNOSIS — K8689 Other specified diseases of pancreas: Secondary | ICD-10-CM | POA: Insufficient documentation

## 2016-02-26 LAB — CBC
HEMATOCRIT: 40.9 % (ref 39.0–52.0)
HEMOGLOBIN: 13.4 g/dL (ref 13.0–17.0)
MCH: 28.9 pg (ref 26.0–34.0)
MCHC: 32.8 g/dL (ref 30.0–36.0)
MCV: 88.3 fL (ref 78.0–100.0)
Platelets: 246 10*3/uL (ref 150–400)
RBC: 4.63 MIL/uL (ref 4.22–5.81)
RDW: 13.3 % (ref 11.5–15.5)
WBC: 9.8 10*3/uL (ref 4.0–10.5)

## 2016-02-26 LAB — BASIC METABOLIC PANEL
Anion gap: 9 (ref 5–15)
BUN: 24 mg/dL — ABNORMAL HIGH (ref 6–20)
CALCIUM: 7.6 mg/dL — AB (ref 8.9–10.3)
CHLORIDE: 101 mmol/L (ref 101–111)
CO2: 29 mmol/L (ref 22–32)
Creatinine, Ser: 0.81 mg/dL (ref 0.61–1.24)
GFR calc Af Amer: >60 mL/min (ref >60)
GLUCOSE: 196 mg/dL — AB (ref 65–99)
Potassium: 3.6 mmol/L (ref 3.5–5.1)
SODIUM: 139 mmol/L (ref 135–145)

## 2016-02-26 LAB — GLUCOSE, CAPILLARY
GLUCOSE-CAPILLARY: 176 mg/dL — AB (ref 65–99)
GLUCOSE-CAPILLARY: 208 mg/dL — AB (ref 65–99)
Glucose-Capillary: 137 mg/dL — ABNORMAL HIGH (ref 65–99)
Glucose-Capillary: 161 mg/dL — ABNORMAL HIGH (ref 65–99)

## 2016-02-26 MED ORDER — SODIUM CHLORIDE 0.9 % IV SOLN
INTRAVENOUS | Status: AC
  Filled 2016-02-26: qty 500

## 2016-02-26 MED ORDER — ONDANSETRON HCL 4 MG/2ML IJ SOLN: 4.0000 mg | Freq: Four times a day (QID) | INTRAMUSCULAR | Status: DC | PRN

## 2016-02-26 NOTE — Progress Notes (Signed)
RN notified by MRI that patient unable to fit into MRI machine.  Dr. Sarajane Jews on unit and notified.

## 2016-02-26 NOTE — Progress Notes (Signed)
Inpatient Diabetes Program Recommendations  AACE/ADA: New Consensus Statement on Inpatient Glycemic Control (2015)  Target Ranges:  Prepandial:   less than 140 mg/dL      Peak postprandial:   less than 180 mg/dL (1-2 hours)      Critically ill patients:  140 - 180 mg/dL  Results for HARDWICK, STREIFEL (MRN [id number]) as of 02/26/2016 09:21  Ref. Range 02/25/2016 17:43 02/25/2016 19:58 02/26/2016 01:09 02/26/2016 06:20  Glucose-Capillary Latest Ref Range: 65-99 mg/dL 202 (H) 212 (H) 176 (H) 208 (H)   Review of Glycemic Control  Diabetes history: DM2 Outpatient Diabetes medications: Metformin 500 mg QAM Current orders for Inpatient glycemic control: Novolog 0-9 units Q6H  Inpatient Diabetes Program Recommendations: Correction (SSI): Please consider changing frequency of CBGs and Novolog correction to Q4H. HgbA1C: A1C in process.  Thanks, Barnie Alderman, RN, MSN, CDE Diabetes Coordinator Inpatient Diabetes Program [phone] (Team Pager from [place] to [place]) [phone] ([place] office) [phone] [facility] office) [phone] [facility] office)

## 2016-02-26 NOTE — Progress Notes (Addendum)
PROGRESS NOTE  [name] [id number] DOB: [birth year] DOA: 02/25/2016 PCP: Glo Herring., MD Outpatient Specialists:  Brief Narrative: 55 year old man presented with sudden onset of nausea and vomiting with associated abdominal pain. CT suggested ileus versus partial small bowel obstruction. Followed by surgery, current plan for conservative management, hopeful for spontaneous resolution. Etiology remains unclear. MRI pending to further evaluate pancreatic mass.  Assessment/Plan: 1. Vomiting with abdominal pain and small bowel dilatation, presumed ileus. Improving, passing flatus. Multiple sick contacts. Favor ileus rather than PSBO. Does have h/o PSBO 2007. Discussed with surgery. Conservative management recommended at this point. 2. Dehydration, resolved.  3. Pancreatitic tail mass, amenable to percutaneous biopsy.  4. DM type 2 with hyperglycemia. Stable.   Continue conservative management. NG tube, pain management and antiemetics as needed.  Plan for MRI today--patient could not fit in the scanner. Will need to pursue as an outpatient.  Follow up general surgery recommendations.   DVT prophylaxis: SCDs Code Status: Full Family Communication: Wife bedside.  Disposition Plan: Anticipate discharge in 1-2 days.   Murray Hodgkins, MD  [facility] Direct contact:  --Via amion app [place]  --[email or web address]; password [email or web address] and click  [phone] contact night coverage as above 02/26/2016, 7:30 AM  LOS: 1 day   Consultants:  General surgery   Procedures:  None  Antimicrobials:  None  HPI/Subjective: Feels the same as yesterday. Pain is described as cramping. Denies nausea and vomiting. Mild flatus no diarrhea.   Objective: Filed Vitals:   02/25/16 1455 02/25/16 1924 02/25/16 1956 02/26/16 0600  BP: 169/86  149/81 150/80  Pulse: 84  83 90  Temp: 98.1 F (36.7 C)  98.2 F (36.8 C) 97.9 F (36.6 C)  TempSrc: Oral  Oral Oral  Resp:   19 18  Height: 5\' 10"   (1.778 m)     Weight:      SpO2: 96% 92% 94% 97%    Intake/Output Summary (Last 24 hours) at 02/26/16 0730 Last data filed at 02/26/16 0700  Gross per 24 hour  Intake      0 ml  Output   1950 ml  Net  -1950 ml     Filed Weights   02/25/16 0854  Weight: 112.038 kg (247 lb)    Exam:  Afebrile, VSS, not hypoxic  Constitutional:  Appears calm and comfortabl Respiratory:  CTA bilaterally, no w/r/r.  Respiratory effort normal. No retractions or accessory muscle use Cardiovascular:  RRR, no m/r/g No LE extremity edema   Abdomen:  No tenderness. Distended  but softer. Positive bowel sounds.  Psychiatric:  Mental status Mood, affect appropriate  I have personally reviewed following labs and imaging studies:  CBG stable   BMP unremarkable, BUN and Creatinine back to normal   CBC unremarkable  Scheduled Meds: . insulin aspart  0-9 Units Subcutaneous Q6H   Continuous Infusions: . sodium chloride 150 mL/hr at 02/26/16 0550    Principal Problem:   Ileus ([place]) Active Problems:   Pancreatic mass   Nausea with vomiting   Dehydration   LOS: 1 day   Time spent 20 minutes   By signing my name below, I, Rennis Harding attest that this documentation has been prepared under the direction and in the presence of Murray Hodgkins, MD Electronically signed: Rennis Harding  02/26/2016 10:35am   I personally performed the services described in this documentation. All medical record entries made by the scribe were at my direction. I have reviewed the chart and agree that the record reflects my personal  performance and is accurate and complete. Murray Hodgkins, MD

## 2016-02-26 NOTE — Consult Note (Signed)
Reason for Consult: Bowel obstruction versus ileus Referring Physician: Dr. [name] is an 55 y.o. male.  HPI: Patient is a 55 year old white male who presented to the emergency room with worsening abdominal pain, bloating, nausea. He last had a bowel movement 2 days earlier. He states she normally has bowel movements daily. He had a similar episode of a bowel obstruction approximately 9 years ago. Extensive workup at that time was negative for any etiology. He has not had abdominal surgery in the past. While in the hospital, he has passed gas but has not had a bowel movement. He has had some relief of the bloating with an NG tube in place. He is thirsty. Patient last had a colonoscopy 10 years ago and is due later this year for a follow-up screening colonoscopy.  Past Medical History  Diagnosis Date  . Hypertension   . Diabetes mellitus without complication [facility])     Past Surgical History  Procedure Laterality Date  . Tonsillectomy    . Foot surgery      Family History  Problem Relation Age of Onset  . Stroke Mother   . Hypertension Mother   . Diabetes Mother   . CAD Mother     Social History:  reports that he has never smoked. His smokeless tobacco use includes Chew. He reports that he drinks about 0.6 oz of alcohol per week. He reports that he does not use illicit drugs.  Allergies:  Allergies  Allergen Reactions  . Penicillins Rash    Has patient had a PCN reaction causing immediate rash, facial/tongue/throat swelling, SOB or lightheadedness with hypotension: Yes Has patient had a PCN reaction causing severe rash involving mucus membranes or skin necrosis: No Has patient had a PCN reaction that required hospitalization No Has patient had a PCN reaction occurring within the last 10 years: No If all of the above answers are "NO", then may proceed with Cephalosporin use.     Medications: I have reviewed the patient's current medications.  Results for orders  placed or performed during the hospital encounter of 02/25/16 (from the past 48 hour(s))  Urinalysis, Routine w reflex microscopic     Status: Abnormal   Collection Time: 02/25/16  9:00 AM  Result Value Ref Range   Color, Urine YELLOW YELLOW   APPearance CLEAR CLEAR   Specific Gravity, Urine >1.030 (H) 1.005 - 1.030   pH 5.5 5.0 - 8.0   Glucose, UA 500 (A) NEGATIVE mg/dL   Hgb urine dipstick NEGATIVE NEGATIVE   Bilirubin Urine SMALL (A) NEGATIVE   Ketones, ur 15 (A) NEGATIVE mg/dL   Protein, ur 30 (A) NEGATIVE mg/dL   Nitrite NEGATIVE NEGATIVE   Leukocytes, UA NEGATIVE NEGATIVE  Urine microscopic-add on     Status: Abnormal   Collection Time: 02/25/16  9:00 AM  Result Value Ref Range   Squamous Epithelial / LPF 0-5 (A) NONE SEEN   WBC, UA NONE SEEN 0 - 5 WBC/hpf   RBC / HPF 0-5 0 - 5 RBC/hpf   Bacteria, UA FEW (A) NONE SEEN   Casts GRANULAR CAST (A) NEGATIVE  Comprehensive metabolic panel     Status: Abnormal   Collection Time: 02/25/16  9:20 AM  Result Value Ref Range   Sodium 136 135 - 145 mmol/L   Potassium 3.5 3.5 - 5.1 mmol/L   Chloride 97 (L) 101 - 111 mmol/L   CO2 23 22 - 32 mmol/L   Glucose, Bld 329 (H) 65 - 99  mg/dL   BUN 31 (H) 6 - 20 mg/dL   Creatinine, Ser 1.25 (H) 0.61 - 1.24 mg/dL   Calcium 8.4 (L) 8.9 - 10.3 mg/dL   Total Protein 8.1 6.5 - 8.1 g/dL   Albumin 4.2 3.5 - 5.0 g/dL   AST 28 15 - 41 U/L   ALT 36 17 - 63 U/L   Alkaline Phosphatase 56 38 - 126 U/L   Total Bilirubin 1.3 (H) 0.3 - 1.2 mg/dL   GFR calc non Af Amer >60 >60 mL/min   GFR calc Af Amer >60 >60 mL/min    Comment: (NOTE) The eGFR has been calculated using the CKD EPI equation. This calculation has not been validated in all clinical situations. eGFR's persistently <60 mL/min signify possible Chronic Kidney Disease.    Anion gap 16 (H) 5 - 15  CBC with Differential     Status: Abnormal   Collection Time: 02/25/16  9:20 AM  Result Value Ref Range   WBC 15.4 (H) 4.0 - 10.5 K/uL   RBC  5.53 4.22 - 5.81 MIL/uL   Hemoglobin 15.4 13.0 - 17.0 g/dL   HCT 47.5 39.0 - 52.0 %   MCV 85.9 78.0 - 100.0 fL   MCH 27.8 26.0 - 34.0 pg   MCHC 32.4 30.0 - 36.0 g/dL   RDW 13.0 11.5 - 15.5 %   Platelets 316 150 - 400 K/uL   Neutrophils Relative % 84 %   Neutro Abs 12.9 (H) 1.7 - 7.7 K/uL   Lymphocytes Relative 6 %   Lymphs Abs 0.9 0.7 - 4.0 K/uL   Monocytes Relative 10 %   Monocytes Absolute 1.6 (H) 0.1 - 1.0 K/uL   Eosinophils Relative 0 %   Eosinophils Absolute 0.0 0.0 - 0.7 K/uL   Basophils Relative 0 %   Basophils Absolute 0.0 0.0 - 0.1 K/uL  Lipase, blood     Status: None   Collection Time: 02/25/16  9:20 AM  Result Value Ref Range   Lipase 17 11 - 51 U/L  Glucose, capillary     Status: Abnormal   Collection Time: 02/25/16  5:43 PM  Result Value Ref Range   Glucose-Capillary 202 (H) 65 - 99 mg/dL   Comment 1 Notify RN    Comment 2 Document in Chart   Glucose, capillary     Status: Abnormal   Collection Time: 02/25/16  7:58 PM  Result Value Ref Range   Glucose-Capillary 212 (H) 65 - 99 mg/dL  Glucose, capillary     Status: Abnormal   Collection Time: 02/26/16  1:09 AM  Result Value Ref Range   Glucose-Capillary 176 (H) 65 - 99 mg/dL  Glucose, capillary     Status: Abnormal   Collection Time: 02/26/16  6:20 AM  Result Value Ref Range   Glucose-Capillary 208 (H) 65 - 99 mg/dL  Basic metabolic panel     Status: Abnormal   Collection Time: 02/26/16  6:26 AM  Result Value Ref Range   Sodium 139 135 - 145 mmol/L   Potassium 3.6 3.5 - 5.1 mmol/L   Chloride 101 101 - 111 mmol/L   CO2 29 22 - 32 mmol/L   Glucose, Bld 196 (H) 65 - 99 mg/dL   BUN 24 (H) 6 - 20 mg/dL   Creatinine, Ser 0.81 0.61 - 1.24 mg/dL   Calcium 7.6 (L) 8.9 - 10.3 mg/dL   GFR calc non Af Amer >60 >60 mL/min   GFR calc Af Amer >60 >60 mL/min  Comment: (NOTE) The eGFR has been calculated using the CKD EPI equation. This calculation has not been validated in all clinical situations. eGFR's  persistently <60 mL/min signify possible Chronic Kidney Disease.    Anion gap 9 5 - 15  CBC     Status: None   Collection Time: 02/26/16  6:26 AM  Result Value Ref Range   WBC 9.8 4.0 - 10.5 K/uL   RBC 4.63 4.22 - 5.81 MIL/uL   Hemoglobin 13.4 13.0 - 17.0 g/dL   HCT 40.9 39.0 - 52.0 %   MCV 88.3 78.0 - 100.0 fL   MCH 28.9 26.0 - 34.0 pg   MCHC 32.8 30.0 - 36.0 g/dL   RDW 13.3 11.5 - 15.5 %   Platelets 246 150 - 400 K/uL  Glucose, capillary     Status: Abnormal   Collection Time: 02/26/16 11:16 AM  Result Value Ref Range   Glucose-Capillary 161 (H) 65 - 99 mg/dL   Comment 1 Notify RN    Comment 2 Document in Chart     Ct Abdomen Pelvis W Contrast  02/25/2016  CLINICAL DATA:  Abdominal pain for 1 day with vomiting EXAM: CT ABDOMEN AND PELVIS WITH CONTRAST TECHNIQUE: Multidetector CT imaging of the abdomen and pelvis was performed using the standard protocol following bolus administration of intravenous contrast. CONTRAST:  163m ISOVUE-300 IOPAMIDOL (ISOVUE-300) INJECTION 61% COMPARISON:  09/22/2006 FINDINGS: Lung bases are free of acute infiltrate or sizable effusion. The liver is diffusely fatty infiltrated without focal mass lesion. A tiny hypodensity is noted in the right lobe consistent with a small cyst. The gallbladder, spleen, left adrenal gland and kidneys are within normal limits. Fullness in the right adrenal gland is noted consistent with small adenoma. This is stable from the prior exam. The pancreas is well visualized and arising from the tail of the pancreas there is a peripherally enhancing soft tissue mass lesion which measures approximately 3.3 x 3.1 cm. A central calcification is noted within. Stomach is significantly distended with ingested contrast material. Multiple loops of dilated small bowel are identified in the mid to distal jejunum and proximal ileum. No definitive discrete transition zone is identified. No mass lesion is seen. The bladder is well distended. No  pelvic mass lesion is seen. No significant lymphadenopathy is noted. The osseous structures are within normal limits. IMPRESSION: Pancreatic tail mass as described above. Further workup is recommended. MRI may be helpful in this regard. Additionally this lesion would be amenable percutaneous biopsy. Small bowel dilatation without definitive mass lesion. This may represent a mild small bowel ileus or partial small bowel obstruction. Clinical correlation with physical exam is recommended. Stable right adrenal adenoma. Electronically Signed   By: MInez CatalinaM.D.   On: 02/25/2016 12:40    ROS: See chart Blood pressure 119/68, pulse 77, temperature 98.1 F (36.7 C), temperature source Oral, resp. rate 18, height 5' 10" (1.778 m), weight 112.038 kg (247 lb), SpO2 94 %. Physical Exam: Pleasant white male in no acute distress. Abdomen is soft with mild distention noted, but no rigidity or tenderness are noted. There is bowel sounds appreciated. Rectal examination was deferred at this time.  Assessment/Plan: Impression: Abdominal distention most likely secondary to ileus of unknown etiology. Patient does not appear to have a bowel obstructive pattern consistent with adhesive disease present this time. He does have a pancreatic tail mass that requires further workup. Agree with MRI of the pancreas. Will let patient have ice chips. No need for acute surgical  intervention. May remove NG tube in a.m. if patient progresses well.  [name] A 02/26/2016, 1:40 PM

## 2016-02-27 DIAGNOSIS — K869 Disease of pancreas, unspecified: Secondary | ICD-10-CM

## 2016-02-27 DIAGNOSIS — K567 Ileus, unspecified: Secondary | ICD-10-CM

## 2016-02-27 LAB — HEMOGLOBIN A1C
Hgb A1c MFr Bld: 8.4 % — ABNORMAL HIGH (ref 4.8–5.6)
Mean Plasma Glucose: 194 mg/dL

## 2016-02-27 LAB — GLUCOSE, CAPILLARY
GLUCOSE-CAPILLARY: 147 mg/dL — AB (ref 65–99)
GLUCOSE-CAPILLARY: 130 mg/dL — AB (ref 65–99)
GLUCOSE-CAPILLARY: 157 mg/dL — AB (ref 65–99)
Glucose-Capillary: 165 mg/dL — ABNORMAL HIGH (ref 65–99)

## 2016-02-27 NOTE — Care Management Note (Signed)
Case Management Note  Patient Details  Name: ANGAS SHINDLEDECKER MRN: [id number] Date of Birth: [birth year]  Subjective/Objective:                  Pt admitted with ileus. Pt is from home and is ind with ADL's. Pt has PCP, transportation and no difficulty obtaining medications. Pt plans to return home with self care at DC.   Action/Plan: No CM needs anticipated.   Expected Discharge Date:    02/28/2016              Expected Discharge Plan:  Home/Self Care  In-House Referral:  NA  Discharge planning Services  CM Consult  Post Acute Care Choice:  NA Choice offered to:  NA  DME Arranged:    DME Agency:     HH Arranged:    HH Agency:     Status of Service:  Completed, signed off  Medicare Important Message Given:    Date Medicare IM Given:    Medicare IM give by:    Date Additional Medicare IM Given:    Additional Medicare Important Message give by:     If discussed at [place] of Stay Meetings, dates discussed:    Additional Comments:  Sherald Barge, RN 02/27/2016, 2:31 PM

## 2016-02-27 NOTE — Progress Notes (Signed)
  Subjective: Patient is now passing gas and feeling better. Has not had a bowel movement yet. He is hungry. No abdominal pain is noted.  Objective: Vital signs in last 24 hours: Temp:  [97.8 F (36.6 C)-98.9 F (37.2 C)] 97.8 F (36.6 C) (04/19 0643) Pulse Rate:  [77-83] 80 (04/19 0643) Resp:  [18] 18 (04/19 0643) BP: (119-151)/(64-73) 128/73 mmHg (04/19 0643) SpO2:  [92 %-97 %] 97 % (04/19 0643) Last BM Date: 02/26/16  Intake/Output from previous day: 04/18 0701 - 04/19 0700 In: 3557.5 [I.V.:3557.5] Out: 2030 [Urine:1800; Emesis/NG output:230] Intake/Output this shift:    General appearance: alert, cooperative and no distress GI: soft, non-tender; bowel sounds normal; no masses,  no organomegaly  Lab Results:   Recent Labs  02/25/16 0920 02/26/16 0626  WBC 15.4* 9.8  HGB 15.4 13.4  HCT 47.5 40.9  PLT 316 246   BMET  Recent Labs  02/25/16 0920 02/26/16 0626  NA 136 139  K 3.5 3.6  CL 97* 101  CO2 23 29  GLUCOSE 329* 196*  BUN 31* 24*  CREATININE 1.25* 0.81  CALCIUM 8.4* 7.6*   PT/INR No results for input(s): LABPROT, INR in the last 72 hours.  Studies/Results: No results found.  Anti-infectives: Anti-infectives    None      Assessment/Plan: Impression: Bowel ileus, resolving Plan: Will discontinue NG tube and advance to full liquid diet. No need for acute surgical intervention. Anticipate discharge in next 24-48 hours if patient continues to improve.  LOS: 2 days    Jaylon Boylen A 02/27/2016

## 2016-02-27 NOTE — Progress Notes (Signed)
[facility] PROGRESS NOTE  [name] [id number] DOB: [birth year] DOA: 02/25/2016 PCP: Glo Herring., MD  Assessment/Plan: Small bowel ileus -Clinically resolving. -Seen by surgery today who has discontinued NG tube and advance diet to full liquids. -Plan to continue to increase diet and discharge home over the next 24-48 hours if he continues to improve.  Pancreatic tail mass -We'll need to pursue outpatient MRI and continued follow-up as patient could not fit in the scanner given his size.  Code Status: Full code Family Communication: Patient only  Disposition Plan: Hopeful for discharge home over the next 1-2 days   Consultants:  Surgery   Antibiotics:  None   Subjective: Feels better, wants diet advanced  Objective: Filed Vitals:   02/26/16 2031 02/26/16 2128 02/27/16 0643 02/27/16 1301  BP:  151/64 128/73 136/73  Pulse:  83 80 89  Temp:  98.9 F (37.2 C) 97.8 F (36.6 C) 98.5 F (36.9 C)  TempSrc:  Oral Oral Oral  Resp:  18 18 18   Height:      Weight:      SpO2: 92% 96% 97% 98%    Intake/Output Summary (Last 24 hours) at 02/27/16 1840 Last data filed at 02/27/16 1600  Gross per 24 hour  Intake 2927.5 ml  Output   2030 ml  Net  897.5 ml   Filed Weights   02/25/16 0854  Weight: 112.038 kg (247 lb)    Exam:   General:  Alert, awake, oriented  Cardiovascular: Regular rate and rhythm  Respiratory: Clear to auscultation bilaterally  Abdomen: Distended, hypoactive bowel sounds, soft  Extremities: Trace bilateral edema   Neurologic:  Grossly intact and nonfocal  Data Reviewed: Basic Metabolic Panel:  Recent Labs Lab 02/25/16 0920 02/26/16 0626  NA 136 139  K 3.5 3.6  CL 97* 101  CO2 23 29  GLUCOSE 329* 196*  BUN 31* 24*  CREATININE 1.25* 0.81  CALCIUM 8.4* 7.6*   Liver Function Tests:  Recent Labs Lab 02/25/16 0920  AST 28  ALT 36  ALKPHOS 56  BILITOT 1.3*  PROT 8.1  ALBUMIN 4.2    Recent  Labs Lab 02/25/16 0920  LIPASE 17   No results for input(s): AMMONIA in the last 168 hours. CBC:  Recent Labs Lab 02/25/16 0920 02/26/16 0626  WBC 15.4* 9.8  NEUTROABS 12.9*  --   HGB 15.4 13.4  HCT 47.5 40.9  MCV 85.9 88.3  PLT 316 246   Cardiac Enzymes: No results for input(s): CKTOTAL, CKMB, CKMBINDEX, TROPONINI in the last 168 hours. BNP (last 3 results) No results for input(s): BNP in the last 8760 hours.  ProBNP (last 3 results) No results for input(s): PROBNP in the last 8760 hours.  CBG:  Recent Labs Lab 02/26/16 1116 02/27/16 02/27/16 0641 02/27/16 1112 02/27/16 1746  GLUCAP 161* 137* 147* 130* 157*    No results found for this or any previous visit (from the past 240 hour(s)).   Studies: No results found.  Scheduled Meds: . insulin aspart  0-9 Units Subcutaneous Q6H   Continuous Infusions:   Principal Problem:   Ileus (HCC) Active Problems:   Pancreatic mass   Nausea with vomiting   Dehydration   Mass of pancreas    Time spent: 25 minutes. Greater than 50% of this time was spent in direct contact with the patient coordinating care.    Lelon Frohlich  [facility] Pager [phone]  If 7PM-7AM, please contact night-coverage at [email or web address], password [email or web address] 02/27/2016, 6:40  PM  LOS: 2 days

## 2016-02-28 ENCOUNTER — Inpatient Hospital Stay (HOSPITAL_COMMUNITY): Payer: Commercial Managed Care - PPO

## 2016-02-28 LAB — GLUCOSE, CAPILLARY
Glucose-Capillary: 169 mg/dL — ABNORMAL HIGH (ref 65–99)
Glucose-Capillary: 179 mg/dL — ABNORMAL HIGH (ref 65–99)
Glucose-Capillary: 137 mg/dL — ABNORMAL HIGH (ref 65–99)

## 2016-02-28 NOTE — Progress Notes (Signed)
  Subjective: Patient feels fine. Tolerating diet well. Has had a bowel movement.  Objective: Vital signs in last 24 hours: Temp:  [97.9 F (36.6 C)-98.5 F (36.9 C)] 97.9 F (36.6 C) (04/20 0555) Pulse Rate:  [68-89] 68 (04/20 0555) Resp:  [18-20] 20 (04/20 0555) BP: (135-140)/(73-84) 140/84 mmHg (04/20 0555) SpO2:  [95 %-98 %] 98 % (04/20 0555) Last BM Date: 02/27/16  Intake/Output from previous day: 04/19 0701 - 04/20 0700 In: 1560 [P.O.:1560] Out: 500 [Urine:500] Intake/Output this shift:    General appearance: alert, cooperative and no distress GI: soft, non-tender; bowel sounds normal; no masses,  no organomegaly  Lab Results:   Recent Labs  02/25/16 0920 02/26/16 0626  WBC 15.4* 9.8  HGB 15.4 13.4  HCT 47.5 40.9  PLT 316 246   BMET  Recent Labs  02/25/16 0920 02/26/16 0626  NA 136 139  K 3.5 3.6  CL 97* 101  CO2 23 29  GLUCOSE 329* 196*  BUN 31* 24*  CREATININE 1.25* 0.81  CALCIUM 8.4* 7.6*   PT/INR No results for input(s): LABPROT, INR in the last 72 hours.  Studies/Results: No results found.  Anti-infectives: Anti-infectives    None      Assessment/Plan: Impression: Bowel dysfunction, resolved. Plan: Agree with discharge and outpatient MRI to assess pancreatic mass. Patient will follow-up with me as needed. Will sign off.  LOS: 3 days    Cianni Manny A 02/28/2016

## 2016-02-28 NOTE — Progress Notes (Addendum)
Discharge instructions read to patient.  Pt verbalized understanding of all instructions. Discharged to home with family @ 1700

## 2016-02-28 NOTE — Discharge Summary (Signed)
Physician Discharge Summary  [name] [id number] DOB: 11/15/1960 DOA: 02/25/2016  PCP: Glo Herring., MD  Admit date: 02/25/2016 Discharge date: 02/28/2016  Time spent: 45 minutes  Recommendations for Outpatient Follow-up:  -Will be discharged home today.  -advised to follow-up with primary care provider in 2 weeks. -Will need an outpatient open MRI to evaluate pancreatic tail mass as he did not fit in the MRI scanner in the hospital due to his body size.   Discharge Diagnoses:  Principal Problem:   Ileus ([place]) Active Problems:   Pancreatic mass   Nausea with vomiting   Dehydration   Mass of pancreas   Discharge Condition: Stable and improved  Filed Weights   02/25/16 0854  Weight: 112.038 kg (247 lb)    History of present illness:  As per Dr. Sarajane Jews on 4/17: 55 year old man presented with sudden onset of nausea and vomiting with associated abdominal pain. Initial evaluation in imaging suggested ileus versus small bowel obstruction.  Had sudden onset of nausea 4/16 around 2 PM, followed by vomiting. One hour after his vomiting began, he developed diffuse abd pain that he described as dull and persistent. Since then, he has had multiple other episodes of vomiting and has been unable to keep down food or liquids. He also notes that he feels "gassy and bloated". The patient's spouse reports having had diarrhea yesterday and other family member experienced similar episodes last week. He denies any alleviating or aggravating factors other than relief with NG tube. No previous history of abdominal surgery. No history of GI illness.   Of note this is his third episode of very similar symptoms. In 2007 he was hospitalized within the system and CT that time showed partial small bowel obstruction. He also was hospitalized previous to this in [place] and underwent extensive investigation including small bowel capsule study and small bowel follow-through all of which were  normal. His previous 2 episodes did not involve gastroenteritis type symptoms.  Hospital Course:   Small bowel ileus -Clinically resolved; is tolerating a diet and has had a bowel movement today.  Pancreatic tail mass -Will need to pursue outpatient MRI and continued outpatient follow-up as patient could not fit in the scanner given his size.   Procedures:  None  Consultations:  Surgery  Discharge Instructions  Discharge Instructions    Diet - low sodium heart healthy    Complete by:  As directed      Increase activity slowly    Complete by:  As directed             Medication List    TAKE these medications        amLODipine-valsartan 10-160 MG tablet  Commonly known as:  EXFORGE  Take 1 tablet by mouth daily.     aspirin EC 81 MG tablet  Take 81 mg by mouth daily.     hydrochlorothiazide 25 MG tablet  Commonly known as:  HYDRODIURIL  Take 25 mg by mouth daily.     lansoprazole 15 MG capsule  Commonly known as:  PREVACID  Take 15 mg by mouth daily at 12 noon.     metFORMIN 500 MG tablet  Commonly known as:  GLUCOPHAGE  Take 500 mg by mouth daily with breakfast.     simvastatin 20 MG tablet  Commonly known as:  ZOCOR  Take 20 mg by mouth daily.       Allergies  Allergen Reactions  . Penicillins Rash    Has patient  had a PCN reaction causing immediate rash, facial/tongue/throat swelling, SOB or lightheadedness with hypotension: Yes Has patient had a PCN reaction causing severe rash involving mucus membranes or skin necrosis: No Has patient had a PCN reaction that required hospitalization No Has patient had a PCN reaction occurring within the last 10 years: No If all of the above answers are "NO", then may proceed with Cephalosporin use.        Follow-up Information    Follow up with Glo Herring., MD. Schedule an appointment as soon as possible for a visit in 2 weeks.   Specialty:  Internal Medicine   Contact information:   [address] [place] [id number] [phone]        The results of significant diagnostics from this hospitalization (including imaging, microbiology, ancillary and laboratory) are listed below for reference.    Significant Diagnostic Studies: Ct Abdomen Pelvis W Contrast  02/25/2016  CLINICAL DATA:  Abdominal pain for 1 day with vomiting EXAM: CT ABDOMEN AND PELVIS WITH CONTRAST TECHNIQUE: Multidetector CT imaging of the abdomen and pelvis was performed using the standard protocol following bolus administration of intravenous contrast. CONTRAST:  111mL ISOVUE-300 IOPAMIDOL (ISOVUE-300) INJECTION 61% COMPARISON:  09/22/2006 FINDINGS: Lung bases are free of acute infiltrate or sizable effusion. The liver is diffusely fatty infiltrated without focal mass lesion. A tiny hypodensity is noted in the right lobe consistent with a small cyst. The gallbladder, spleen, left adrenal gland and kidneys are within normal limits. Fullness in the right adrenal gland is noted consistent with small adenoma. This is stable from the prior exam. The pancreas is well visualized and arising from the tail of the pancreas there is a peripherally enhancing soft tissue mass lesion which measures approximately 3.3 x 3.1 cm. A central calcification is noted within. Stomach is significantly distended with ingested contrast material. Multiple loops of dilated small bowel are identified in the mid to distal jejunum and proximal ileum. No definitive discrete transition zone is identified. No mass lesion is seen. The bladder is well distended. No pelvic mass lesion is seen. No significant lymphadenopathy is noted. The osseous structures are within normal limits. IMPRESSION: Pancreatic tail mass as described above. Further workup is recommended. MRI may be helpful in this regard. Additionally this lesion would be amenable percutaneous biopsy. Small bowel dilatation without definitive mass lesion. This may represent a mild small bowel ileus or  partial small bowel obstruction. Clinical correlation with physical exam is recommended. Stable right adrenal adenoma. Electronically Signed   By: Inez Catalina M.D.   On: 02/25/2016 12:40   Dg Abd 2 Views  02/28/2016  CLINICAL DATA:  Ileus EXAM: ABDOMEN - 2 VIEW COMPARISON:  02/25/2016 CT abdomen/pelvis FINDINGS: There are a few mildly dilated small bowel loops with air-fluid levels in the bilateral abdomen, significantly decreased in caliber since 02/25/2016. Minimal stool in the colon. No evidence of pneumatosis or pneumoperitoneum. No pathologic soft tissue calcifications. Mild degenerative changes in the visualized thoracolumbar spine. IMPRESSION: Improving adynamic ileus of the small bowel. Electronically Signed   By: Ilona Sorrel M.D.   On: 02/28/2016 10:56    Microbiology: No results found for this or any previous visit (from the past 240 hour(s)).   Labs: Basic Metabolic Panel:  Recent Labs Lab 02/25/16 0920 02/26/16 0626  NA 136 139  K 3.5 3.6  CL 97* 101  CO2 23 29  GLUCOSE 329* 196*  BUN 31* 24*  CREATININE 1.25* 0.81  CALCIUM 8.4* 7.6*   Liver  Function Tests:  Recent Labs Lab 02/25/16 0920  AST 28  ALT 36  ALKPHOS 56  BILITOT 1.3*  PROT 8.1  ALBUMIN 4.2    Recent Labs Lab 02/25/16 0920  LIPASE 17   No results for input(s): AMMONIA in the last 168 hours. CBC:  Recent Labs Lab 02/25/16 0920 02/26/16 0626  WBC 15.4* 9.8  NEUTROABS 12.9*  --   HGB 15.4 13.4  HCT 47.5 40.9  MCV 85.9 88.3  PLT 316 246   Cardiac Enzymes: No results for input(s): CKTOTAL, CKMB, CKMBINDEX, TROPONINI in the last 168 hours. BNP: BNP (last 3 results) No results for input(s): BNP in the last 8760 hours.  ProBNP (last 3 results) No results for input(s): PROBNP in the last 8760 hours.  CBG:  Recent Labs Lab 02/27/16 0641 02/27/16 1112 02/27/16 1746 02/27/16 2354 02/28/16 0554  GLUCAP 147* 130* 157* 165* 169*       Signed:  HERNANDEZ ACOSTA,ESTELA  [facility] Pager: [phone] 02/28/2016, 11:00 AM

## 2016-03-24 ENCOUNTER — Encounter: Payer: Self-pay | Admitting: Internal Medicine

## 2016-04-01 ENCOUNTER — Encounter (INDEPENDENT_AMBULATORY_CARE_PROVIDER_SITE_OTHER): Payer: Self-pay

## 2016-04-01 ENCOUNTER — Encounter: Payer: Self-pay | Admitting: Internal Medicine

## 2016-04-01 ENCOUNTER — Ambulatory Visit (INDEPENDENT_AMBULATORY_CARE_PROVIDER_SITE_OTHER): Payer: Commercial Managed Care - PPO | Admitting: Internal Medicine

## 2016-04-01 VITALS — BP 135/86 | HR 80 | Temp 98.2°F | Ht 71.0 in | Wt 249.0 lb

## 2016-04-01 DIAGNOSIS — K219 Gastro-esophageal reflux disease without esophagitis: Secondary | ICD-10-CM | POA: Diagnosis not present

## 2016-04-01 DIAGNOSIS — K8689 Other specified diseases of pancreas: Secondary | ICD-10-CM

## 2016-04-01 DIAGNOSIS — K869 Disease of pancreas, unspecified: Secondary | ICD-10-CM | POA: Diagnosis not present

## 2016-04-01 NOTE — Patient Instructions (Signed)
Referral to [facility] for EUS along with further management of pancreatic mass  We will get you an appointment ASAP

## 2016-04-01 NOTE — Progress Notes (Signed)
Primary Care Physician:  Glo Herring., MD Primary Gastroenterologist:  Dr. Gala Romney  Pre-Procedure History & Physical: HPI:  [name] is a 55 y.o. male here for further evaluation of a pancreatic mass and screening colonoscopy.  Patient was admitted to the hospital last month with abdominal pain, nausea and vomiting. He was found to have nonspecific dilated loops of small bowel without a transition point. Felt to have either an ileus or early small bowel obstruction. Symptoms resolved with conservative management.  The CT scan also revealed a 3.3 x3.1 cm mass in the tail of the pancreas with central calcifications. There is no evidence of metastatic disease. An MRI was recommended. This was done at [facility]. remove off help on May 16 which confirmed a 34x29 mm mass arising out of the anterior aspect the pancreatic tail. No evidence of metastatic disease.  Since patient's hospitalization, he states is back to baseline. He denies any abdominal pain, whatsoever. He has had no jaundice good color stools or dark-colored urine. No fever or chills.  His hospitalization his liver panel look good except for slightly elevated bilirubin of 1.3-not fractionated. White count was initially 15,400 but normalized to 9.8 thousand 24 hours later.  Patient has a history of partial small bowel obstructions with 2 other episodes about 10 years ago for which he was hospitalized in [place]. Although there was some concern about Crohn's disease, he underwent ileocolonoscopy in a capsule study of the air which revealed no evidence of inflammatory bowel disease.  Patient's only other chronic GI symptoms are that of GERD well-controlled on Prevacid 15 mg daily. He denies dysphagia, odynophagia early satiety nausea vomiting or weight loss. No melena or hematochezia. Negative colonoscopy 10 years ago; due for average risk screening at this time.   Past Medical History  Diagnosis Date  .  Hypertension   . Diabetes mellitus without complication ([place])   . Small bowel obstruction ([place])   . GERD (gastroesophageal reflux disease)   . Hyperlipidemia   . foot bone spur     Past Surgical History  Procedure Laterality Date  . Tonsillectomy    . Foot surgery    . Givens capsule study  10/12/2006    there was no evidence of active GI bleeding, inflammation or erosions on this study  . Colonoscopy  2007    [facility]- sigmoid colon polyp and TI lymphoid hypoplasia but o/w no evidence of crohn's disease, no bx report available.   Marland Kitchen Hernia repair      as a child    Prior to Admission medications   Medication Sig Start Date End Date Taking? Authorizing Provider  amLODipine-valsartan (EXFORGE) 10-160 MG tablet Take 1 tablet by mouth daily.   Yes Historical Provider, MD  aspirin EC 81 MG tablet Take 81 mg by mouth daily.   Yes Historical Provider, MD  hydrochlorothiazide (HYDRODIURIL) 25 MG tablet Take 25 mg by mouth daily.   Yes Historical Provider, MD  lansoprazole (PREVACID) 15 MG capsule Take 15 mg by mouth daily at 12 noon.   Yes Historical Provider, MD  metFORMIN (GLUCOPHAGE) 500 MG tablet Take 500 mg by mouth daily with breakfast.   Yes Historical Provider, MD  simvastatin (ZOCOR) 20 MG tablet Take 20 mg by mouth daily.   Yes Historical Provider, MD    Allergies as of 04/01/2016 - Review Complete 04/01/2016  Allergen Reaction Noted  . Penicillins Rash 02/25/2016    Family History  Problem Relation Age of Onset  .  Stroke Mother   . Hypertension Mother   . Diabetes Mother   . CAD Mother     Social History   Social History  . Marital Status: Married    Spouse Name: N/A  . Number of Children: N/A  . Years of Education: N/A   Occupational History  . Not on file.   Social History Main Topics  . Smoking status: Never Smoker   . Smokeless tobacco: Current User    Types: Chew  . Alcohol Use: 0.6 oz/week    1 Cans of beer per week  . Drug Use: No  . Sexual  Activity: Yes   Other Topics Concern  . Not on file   Social History Narrative    Review of Systems: See HPI, otherwise negative ROS  Physical Exam: BP 135/86 mmHg  Pulse 80  Temp(Src) 98.2 F (36.8 C)  Ht 5\' 11"  (1.803 m)  Wt 249 lb (112.946 kg)  BMI 34.74 kg/m2 General:   Alert,  Well-developed, well-nourished, pleasant and cooperative in NAD Skin:  Intact without significant lesions or rashes. Eyes:  Sclera clear, no icterus.   Conjunctiva pink. Ears:  Normal auditory acuity. Nose:  No deformity, discharge,  or lesions. Mouth:  No deformity or lesions. Neck:  Supple; no masses or thyromegaly. No significant cervical adenopathy. Lungs:  Clear throughout to auscultation.   No wheezes, crackles, or rhonchi. No acute distress. Heart:  Regular rate and rhythm; no murmurs, clicks, rubs,  or gallops. Abdomen: obese. normal bowel sounds.  Soft and nontender without appreciable mass. Liver edge indistinct at right costal margin. Pulses:  Normal pulses noted. Extremities:  Without clubbing or edema.  Impression: Pleasant 55 year old gentleman recently admitted to the hospital with an ileus versus partial small bowel obstruction; he was found to have a coincidental pancreatic tail mass. CT and MRI imaging showed a mass in the pancreas tail suspicious for neoplasm. He has had 3 bouts of ileus versus partial small bowel obstruction - The first 2 occurred a good 10 years ago with no recurrence until recently.. No evidence of inflammatory bowel disease based on current imaging or on capsule or ileocolonoscopy 10 years ago.  The latest episode partial SBO likely has nothing to do with the pancreatic abnormality seen on imaging.  Colorectal cancer screening needs updating - However, workup of pancreatic tail lesion takes priority at this time.  GERD well-controlled on low-dose PPI without any alarm features.  Recommendations:  Endoscopic ultrasound needed for diagnostic and staging  purposes.  I have called the PALS at [facility] speak to Dr. Jerene Pitch regarding getting his evaluation over there started in expedited fashion. I await a return phone call.  Further recommendations to follow.    Addendum: Spoke to Steward Drone at [facility]. Discussed case with him. He has kindly agreed to get the patient over there for an expedited evaluation. We will send over demographic information.    Notice: This dictation was prepared with Dragon dictation along with smaller phrase technology. Any transcriptional errors that result from this process are unintentional and may not be corrected upon review.

## 2016-04-01 NOTE — Progress Notes (Deleted)
Primary Care Physician:  Glo Herring., MD Primary Gastroenterologist:  Dr. Gala Romney  Pre-Procedure History & Physical: HPI:  [name] is a 55 y.o. male here for   Past Medical History  Diagnosis Date  . Hypertension   . Diabetes mellitus without complication ([place])   . Small bowel obstruction ([place])   . GERD (gastroesophageal reflux disease)   . Hyperlipidemia   . foot bone spur     Past Surgical History  Procedure Laterality Date  . Tonsillectomy    . Foot surgery    . Givens capsule study  10/12/2006    there was no evidence of active GI bleeding, inflammation or erosions on this study  . Colonoscopy  2007    [facility]- sigmoid colon polyp and TI lymphoid hypoplasia but o/w no evidence of crohn's disease, no bx report available.   Marland Kitchen Hernia repair      as a child    Prior to Admission medications   Medication Sig Start Date End Date Taking? Authorizing Provider  amLODipine-valsartan (EXFORGE) 10-160 MG tablet Take 1 tablet by mouth daily.    Historical Provider, MD  aspirin EC 81 MG tablet Take 81 mg by mouth daily.    Historical Provider, MD  hydrochlorothiazide (HYDRODIURIL) 25 MG tablet Take 25 mg by mouth daily.    Historical Provider, MD  lansoprazole (PREVACID) 15 MG capsule Take 15 mg by mouth daily at 12 noon.    Historical Provider, MD  metFORMIN (GLUCOPHAGE) 500 MG tablet Take 500 mg by mouth daily with breakfast.    Historical Provider, MD  simvastatin (ZOCOR) 20 MG tablet Take 20 mg by mouth daily.    Historical Provider, MD    Allergies as of 04/01/2016 - Review Complete 02/25/2016  Allergen Reaction Noted  . Penicillins Rash 02/25/2016    Family History  Problem Relation Age of Onset  . Stroke Mother   . Hypertension Mother   . Diabetes Mother   . CAD Mother     Social History   Social History  . Marital Status: Married    Spouse Name: N/A  . Number of Children: N/A  . Years of Education: N/A   Occupational History  . Not on file.    Social History Main Topics  . Smoking status: Never Smoker   . Smokeless tobacco: Current User    Types: Chew  . Alcohol Use: 0.6 oz/week    1 Cans of beer per week  . Drug Use: No  . Sexual Activity: Yes   Other Topics Concern  . Not on file   Social History Narrative    Review of Systems: See HPI, otherwise negative ROS  Physical Exam: There were no vitals taken for this visit. General:   Alert,  Well-developed, well-nourished, pleasant and cooperative in NAD Skin:  Intact without significant lesions or rashes. Eyes:  Sclera clear, no icterus.   Conjunctiva pink. Ears:  Normal auditory acuity. Nose:  No deformity, discharge,  or lesions. Mouth:  No deformity or lesions. Neck:  Supple; no masses or thyromegaly. No significant cervical adenopathy. Lungs:  Clear throughout to auscultation.   No wheezes, crackles, or rhonchi. No acute distress. Heart:  Regular rate and rhythm; no murmurs, clicks, rubs,  or gallops. Abdomen: Non-distended, normal bowel sounds.  Soft and nontender without appreciable mass or hepatosplenomegaly.  Pulses:  Normal pulses noted. Extremities:  Without clubbing or edema.  Impression/Plan:  ***     Notice: This dictation was prepared with  Dragon dictation along with smaller Company secretary. Any transcriptional errors that result from this process are unintentional and may not be corrected upon review.

## 2016-04-14 DIAGNOSIS — C7A8 Other malignant neuroendocrine tumors: Secondary | ICD-10-CM | POA: Insufficient documentation

## 2016-05-07 ENCOUNTER — Encounter: Payer: Self-pay | Admitting: Internal Medicine

## 2016-05-26 ENCOUNTER — Other Ambulatory Visit: Payer: Self-pay | Admitting: General Surgery

## 2016-05-26 DIAGNOSIS — C7A8 Other malignant neuroendocrine tumors: Secondary | ICD-10-CM

## 2016-08-11 ENCOUNTER — Ambulatory Visit
Admission: RE | Admit: 2016-08-11 | Discharge: 2016-08-11 | Disposition: A | Discharge status: Home / Self Care | Payer: Commercial Managed Care - PPO | Source: Ambulatory Visit | Attending: General Surgery | Admitting: General Surgery

## 2016-08-11 ENCOUNTER — Other Ambulatory Visit: Payer: Commercial Managed Care - PPO

## 2016-08-11 DIAGNOSIS — C7A8 Other malignant neuroendocrine tumors: Secondary | ICD-10-CM

## 2016-08-11 MED ORDER — IOPAMIDOL (ISOVUE-300) INJECTION 61%
125.0000 mL | Freq: Once | INTRAVENOUS | Status: AC | PRN
  Administered 2016-08-11: 125 mL via INTRAVENOUS

## 2016-08-25 ENCOUNTER — Other Ambulatory Visit: Payer: Commercial Managed Care - PPO

## 2016-08-26 ENCOUNTER — Other Ambulatory Visit: Payer: Self-pay | Admitting: General Surgery

## 2016-08-26 DIAGNOSIS — C7A8 Other malignant neuroendocrine tumors: Secondary | ICD-10-CM

## 2016-10-20 ENCOUNTER — Ambulatory Visit
Admission: RE | Admit: 2016-10-20 | Discharge: 2016-10-20 | Disposition: A | Discharge status: Home / Self Care | Payer: Commercial Managed Care - PPO | Source: Ambulatory Visit | Attending: General Surgery | Admitting: General Surgery

## 2016-10-20 DIAGNOSIS — C7A8 Other malignant neuroendocrine tumors: Secondary | ICD-10-CM

## 2016-10-20 MED ORDER — GADOBENATE DIMEGLUMINE 529 MG/ML IV SOLN
20.0000 mL | Freq: Once | INTRAVENOUS | Status: AC | PRN
  Administered 2016-10-20: 20 mL via INTRAVENOUS

## 2016-12-01 DIAGNOSIS — C7A8 Other malignant neuroendocrine tumors: Secondary | ICD-10-CM | POA: Diagnosis not present

## 2016-12-08 DIAGNOSIS — C7A8 Other malignant neuroendocrine tumors: Secondary | ICD-10-CM | POA: Diagnosis not present

## 2016-12-08 DIAGNOSIS — K429 Umbilical hernia without obstruction or gangrene: Secondary | ICD-10-CM | POA: Insufficient documentation

## 2017-02-05 DIAGNOSIS — Z1389 Encounter for screening for other disorder: Secondary | ICD-10-CM | POA: Diagnosis not present

## 2017-02-05 DIAGNOSIS — B9689 Other specified bacterial agents as the cause of diseases classified elsewhere: Secondary | ICD-10-CM | POA: Diagnosis not present

## 2017-03-02 ENCOUNTER — Other Ambulatory Visit: Payer: Self-pay | Admitting: General Surgery

## 2017-03-02 DIAGNOSIS — C7A8 Other malignant neuroendocrine tumors: Secondary | ICD-10-CM

## 2017-03-02 DIAGNOSIS — K429 Umbilical hernia without obstruction or gangrene: Secondary | ICD-10-CM | POA: Diagnosis not present

## 2017-03-10 ENCOUNTER — Ambulatory Visit
Admission: RE | Admit: 2017-03-10 | Discharge: 2017-03-10 | Disposition: A | Discharge status: Home / Self Care | Payer: Commercial Managed Care - PPO | Source: Ambulatory Visit | Attending: General Surgery | Admitting: General Surgery

## 2017-03-10 DIAGNOSIS — C7A8 Other malignant neuroendocrine tumors: Secondary | ICD-10-CM

## 2017-03-10 DIAGNOSIS — D490 Neoplasm of unspecified behavior of digestive system: Secondary | ICD-10-CM | POA: Diagnosis not present

## 2017-03-10 MED ORDER — IOPAMIDOL (ISOVUE-300) INJECTION 61%
125.0000 mL | Freq: Once | INTRAVENOUS | Status: AC | PRN
  Administered 2017-03-10: 125 mL via INTRAVENOUS

## 2017-04-17 DIAGNOSIS — E119 Type 2 diabetes mellitus without complications: Secondary | ICD-10-CM | POA: Diagnosis not present

## 2017-04-17 DIAGNOSIS — Z1389 Encounter for screening for other disorder: Secondary | ICD-10-CM | POA: Diagnosis not present

## 2017-04-17 DIAGNOSIS — K219 Gastro-esophageal reflux disease without esophagitis: Secondary | ICD-10-CM | POA: Diagnosis not present

## 2017-04-17 DIAGNOSIS — I10 Essential (primary) hypertension: Secondary | ICD-10-CM | POA: Diagnosis not present

## 2017-04-17 DIAGNOSIS — E1165 Type 2 diabetes mellitus with hyperglycemia: Secondary | ICD-10-CM | POA: Diagnosis not present

## 2017-04-17 DIAGNOSIS — E782 Mixed hyperlipidemia: Secondary | ICD-10-CM | POA: Diagnosis not present

## 2017-05-25 DIAGNOSIS — D3A8 Other benign neuroendocrine tumors: Secondary | ICD-10-CM | POA: Diagnosis not present

## 2017-05-28 ENCOUNTER — Other Ambulatory Visit: Payer: Self-pay | Admitting: General Surgery

## 2017-05-28 DIAGNOSIS — D3A8 Other benign neuroendocrine tumors: Secondary | ICD-10-CM

## 2017-06-02 ENCOUNTER — Other Ambulatory Visit: Payer: Self-pay | Admitting: General Surgery

## 2017-06-02 DIAGNOSIS — K8689 Other specified diseases of pancreas: Secondary | ICD-10-CM

## 2017-06-02 DIAGNOSIS — D3A8 Other benign neuroendocrine tumors: Secondary | ICD-10-CM

## 2017-06-10 DIAGNOSIS — J019 Acute sinusitis, unspecified: Secondary | ICD-10-CM | POA: Diagnosis not present

## 2017-06-10 DIAGNOSIS — J069 Acute upper respiratory infection, unspecified: Secondary | ICD-10-CM | POA: Diagnosis not present

## 2017-07-07 ENCOUNTER — Telehealth: Payer: Self-pay

## 2017-07-07 NOTE — Telephone Encounter (Signed)
Pt received triage letter in June and was calling to set up his colonoscopy. Please call him at  [phone]

## 2017-07-15 NOTE — Telephone Encounter (Signed)
LMOM to call.

## 2017-07-20 NOTE — Telephone Encounter (Signed)
Gastroenterology Pre-Procedure Review  Request Date: Requesting Physician: Dr. Gerarda Fraction  Last TCS was about 10 years  PATIENT REVIEW QUESTIONS: The patient responded to the following health history questions as indicated:    1. Diabetes Melitis: YES 2. Joint replacements in the past 12 months: NO 3. Major health problems in the past 3 months: NO 4. Has an artificial valve or MVP: NO 5. Has a defibrillator: NO 6. Has been advised in past to take antibiotics in advance of a procedure like teeth cleaning: NO 7. Family history of colon cancer: NO 8. Alcohol Use: YES, BEER EVERY OTHER WEEK 9. History of sleep apnea: NO  10. History of coronary artery or other vascular stents placed within the last 12 months: NO 11. History of any prior anesthesia complications: NO    MEDICATIONS & ALLERGIES:    Patient reports the following regarding taking any blood thinners:   Plavix? NO Aspirin? NO Coumadin? NO Brilinta? NO Xarelto? NO Eliquis? NO Pradaxa? NO Savaysa? NO Effient? NO  Patient confirms/reports the following medications:  Current Outpatient Prescriptions  Medication Sig Dispense Refill  . amLODipine-valsartan (EXFORGE) 10-160 MG tablet Take 1 tablet by mouth daily.    Marland Kitchen aspirin EC 81 MG tablet Take 81 mg by mouth daily.    . hydrochlorothiazide (HYDRODIURIL) 25 MG tablet Take 25 mg by mouth daily.    . insulin glargine (LANTUS) 100 UNIT/ML injection Inject 61 Units into the skin at bedtime.    . lansoprazole (PREVACID) 15 MG capsule Take 15 mg by mouth daily at 12 noon.    . metFORMIN (GLUCOPHAGE) 500 MG tablet Take 1,000 mg by mouth daily with breakfast. 2500 mg daily 1000 mg  am  500 mg lunch 1000 mg pm    . simvastatin (ZOCOR) 20 MG tablet Take 20 mg by mouth daily.     No current facility-administered medications for this visit.     Patient confirms/reports the following allergies:  Allergies  Allergen Reactions  . Penicillins Rash    Has patient had a PCN reaction  causing immediate rash, facial/tongue/throat swelling, SOB or lightheadedness with hypotension: Yes Has patient had a PCN reaction causing severe rash involving mucus membranes or skin necrosis: No Has patient had a PCN reaction that required hospitalization No Has patient had a PCN reaction occurring within the last 10 years: No If all of the above answers are "NO", then may proceed with Cephalosporin use.     No orders of the defined types were placed in this encounter.   AUTHORIZATION INFORMATION Primary Insurance: [facility]  ID #: [id number]  Group #: [id number] Pre-Cert / Josem Kaufmann required:  Pre-Cert / Josem Kaufmann #:    SCHEDULE INFORMATION: Procedure has been scheduled as follows:  Date: , Time:   Location:   This Gastroenterology Pre-Precedure Review Form is being routed to the following provider(s):

## 2017-07-21 ENCOUNTER — Other Ambulatory Visit: Payer: Self-pay

## 2017-07-21 DIAGNOSIS — Z1211 Encounter for screening for malignant neoplasm of colon: Secondary | ICD-10-CM

## 2017-07-21 MED ORDER — CLENPIQ 10-3.5-12 MG-GM -GM/160ML PO SOLN: 1.0000 | Freq: Once | ORAL | 0 refills | Status: AC

## 2017-07-21 NOTE — Telephone Encounter (Signed)
Ok to schedule. DM meds: half the night before, none morning of.

## 2017-07-21 NOTE — Telephone Encounter (Signed)
Pt is set up for 08/31/17 @ 1030. He is aware and instructions are in the mail

## 2017-08-03 DIAGNOSIS — J019 Acute sinusitis, unspecified: Secondary | ICD-10-CM | POA: Diagnosis not present

## 2017-08-28 ENCOUNTER — Encounter (HOSPITAL_COMMUNITY): Payer: Self-pay | Admitting: *Deleted

## 2017-08-31 ENCOUNTER — Encounter (HOSPITAL_COMMUNITY)
Admission: RE | Disposition: A | Discharge status: Home / Self Care | Payer: Self-pay | Source: Ambulatory Visit | Attending: Gastroenterology

## 2017-08-31 ENCOUNTER — Telehealth: Payer: Self-pay | Admitting: Gastroenterology

## 2017-08-31 ENCOUNTER — Ambulatory Visit (HOSPITAL_COMMUNITY)
Admission: RE | Admit: 2017-08-31 | Discharge: 2017-08-31 | Disposition: A | Discharge status: Home / Self Care | Payer: Commercial Managed Care - PPO | Source: Ambulatory Visit | Attending: Gastroenterology | Admitting: Gastroenterology

## 2017-08-31 ENCOUNTER — Encounter (HOSPITAL_COMMUNITY): Payer: Self-pay | Admitting: *Deleted

## 2017-08-31 DIAGNOSIS — D122 Benign neoplasm of ascending colon: Secondary | ICD-10-CM | POA: Diagnosis not present

## 2017-08-31 DIAGNOSIS — I10 Essential (primary) hypertension: Secondary | ICD-10-CM | POA: Insufficient documentation

## 2017-08-31 DIAGNOSIS — Z7982 Long term (current) use of aspirin: Secondary | ICD-10-CM | POA: Diagnosis not present

## 2017-08-31 DIAGNOSIS — Z79899 Other long term (current) drug therapy: Secondary | ICD-10-CM | POA: Diagnosis not present

## 2017-08-31 DIAGNOSIS — Z1212 Encounter for screening for malignant neoplasm of rectum: Secondary | ICD-10-CM

## 2017-08-31 DIAGNOSIS — E785 Hyperlipidemia, unspecified: Secondary | ICD-10-CM | POA: Insufficient documentation

## 2017-08-31 DIAGNOSIS — Z794 Long term (current) use of insulin: Secondary | ICD-10-CM | POA: Diagnosis not present

## 2017-08-31 DIAGNOSIS — R0902 Hypoxemia: Secondary | ICD-10-CM

## 2017-08-31 DIAGNOSIS — D123 Benign neoplasm of transverse colon: Secondary | ICD-10-CM | POA: Diagnosis not present

## 2017-08-31 DIAGNOSIS — E119 Type 2 diabetes mellitus without complications: Secondary | ICD-10-CM | POA: Diagnosis not present

## 2017-08-31 DIAGNOSIS — Z1211 Encounter for screening for malignant neoplasm of colon: Secondary | ICD-10-CM | POA: Diagnosis not present

## 2017-08-31 DIAGNOSIS — K219 Gastro-esophageal reflux disease without esophagitis: Secondary | ICD-10-CM | POA: Diagnosis not present

## 2017-08-31 DIAGNOSIS — R7981 Abnormal blood-gas level: Secondary | ICD-10-CM

## 2017-08-31 DIAGNOSIS — K644 Residual hemorrhoidal skin tags: Secondary | ICD-10-CM | POA: Insufficient documentation

## 2017-08-31 DIAGNOSIS — Q438 Other specified congenital malformations of intestine: Secondary | ICD-10-CM | POA: Diagnosis not present

## 2017-08-31 HISTORY — PX: COLONOSCOPY: SHX5424

## 2017-08-31 LAB — GLUCOSE, CAPILLARY: GLUCOSE-CAPILLARY: 123 mg/dL — AB (ref 65–99)

## 2017-08-31 SURGERY — COLONOSCOPY
Anesthesia: Moderate Sedation

## 2017-08-31 MED ORDER — MEPERIDINE HCL 100 MG/ML IJ SOLN
INTRAMUSCULAR | Status: DC | PRN
  Administered 2017-08-31: 50 mg
  Administered 2017-08-31: 25 mg

## 2017-08-31 MED ORDER — HYDRALAZINE HCL 20 MG/ML IJ SOLN
INTRAMUSCULAR | Status: DC | PRN
  Administered 2017-08-31: 10 mg via INTRAVENOUS

## 2017-08-31 MED ORDER — SODIUM CHLORIDE 0.9 % IV SOLN
INTRAVENOUS | Status: DC
  Administered 2017-08-31: 10:00:00 via INTRAVENOUS

## 2017-08-31 MED ORDER — MIDAZOLAM HCL 5 MG/5ML IJ SOLN
INTRAMUSCULAR | Status: AC
  Filled 2017-08-31: qty 10

## 2017-08-31 MED ORDER — MIDAZOLAM HCL 5 MG/5ML IJ SOLN
INTRAMUSCULAR | Status: DC | PRN
  Administered 2017-08-31 (×2): 2 mg via INTRAVENOUS

## 2017-08-31 MED ORDER — MEPERIDINE HCL 100 MG/ML IJ SOLN
INTRAMUSCULAR | Status: AC
  Filled 2017-08-31: qty 2

## 2017-08-31 MED ORDER — HYDRALAZINE HCL 20 MG/ML IJ SOLN
INTRAMUSCULAR | Status: AC
  Filled 2017-08-31: qty 1

## 2017-08-31 NOTE — Addendum Note (Signed)
Addended by: Inge Rise on: 08/31/2017 02:30 PM   Modules accepted: Orders

## 2017-08-31 NOTE — OR Nursing (Signed)
Blood pressure ran consistently high during procedure Dr. Oneida Alar ordered hydralazine 10mg .

## 2017-08-31 NOTE — Telephone Encounter (Signed)
REFER PT FOR SLEEP APNEA EVALUATION, Dx: DESATURATION/ELEVATED CO2 WITH SEDATION.

## 2017-08-31 NOTE — H&P (Addendum)
Primary Care Physician:  Redmond School, MD Primary Gastroenterologist:  Dr. Gala Romney  Pre-Procedure History & Physical: HPI:  [name] is a 56 y.o. male here for Elsa.  Past Medical History:  Diagnosis Date  . Diabetes mellitus without complication ([place])   . foot bone spur   . GERD (gastroesophageal reflux disease)   . Hyperlipidemia   . Hypertension   . Small bowel obstruction [facility])     Past Surgical History:  Procedure Laterality Date  . COLONOSCOPY  2007   [facility]- sigmoid colon polyp and TI lymphoid hypoplasia but o/w no evidence of crohn's disease, no bx report available.   Marland Kitchen FOOT SURGERY    . GIVENS CAPSULE STUDY  10/12/2006   there was no evidence of active GI bleeding, inflammation or erosions on this study  . HERNIA REPAIR     as a child  . Pancreatic cancer surgery    . TONSILLECTOMY      Prior to Admission medications   Medication Sig Start Date End Date Taking? Authorizing Provider  amLODipine-valsartan (EXFORGE) 10-160 MG tablet Take 1 tablet by mouth daily.   Yes [provider]  aspirin EC 81 MG tablet Take 81 mg by mouth daily.   Yes [provider]  furosemide (LASIX) 20 MG tablet Take 1 tablet by mouth daily. 07/31/17  Yes [provider]  hydrochlorothiazide (HYDRODIURIL) 25 MG tablet Take 25 mg by mouth daily.   Yes [provider]  insulin glargine (LANTUS) 100 UNIT/ML injection Inject 61 Units into the skin every morning.    Yes [provider]  lansoprazole (PREVACID) 15 MG capsule Take 15 mg by mouth daily.    Yes [provider]  metFORMIN (GLUCOPHAGE) 500 MG tablet Take 500-1,000 mg by mouth See admin instructions. (2500 mg daily) 1000 mg  am  500 mg lunch 1000 mg pm   Yes [provider]  Multiple Vitamins-Minerals (MULTIVITAMIN WITH MINERALS) tablet Take 1 tablet by mouth daily.   Yes [provider]  simvastatin (ZOCOR) 20 MG tablet Take 20 mg by  mouth daily.   Yes [provider]    Allergies as of 07/21/2017 - Review Complete 07/20/2017  Allergen Reaction Noted  . Penicillins Rash 02/25/2016    Family History  Problem Relation Age of Onset  . Stroke Mother   . Hypertension Mother   . Diabetes Mother   . CAD Mother   . Colon cancer Neg Hx     Social History   Social History  . Marital status: Married    Spouse name: N/A  . Number of children: N/A  . Years of education: N/A   Occupational History  . Not on file.   Social History Main Topics  . Smoking status: Never Smoker  . Smokeless tobacco: Current User    Types: Snuff  . Alcohol use 0.6 oz/week    1 Cans of beer per week  . Drug use: No  . Sexual activity: Yes   Other Topics Concern  . Not on file   Social History Narrative  . No narrative on file    Review of Systems: See HPI, otherwise negative ROS   Physical Exam: BP (!) 129/94   Pulse 72   Temp 98.1 F (36.7 C) (Oral)   Resp 19   Ht 5\' 11"  (1.803 m)   Wt 240 lb (108.9 kg)   SpO2 98%   BMI 33.47 kg/m  General:   Alert,  pleasant  and cooperative in NAD Head:  Normocephalic and atraumatic. Neck:  Supple; Lungs:  Clear throughout to auscultation.    Heart:  Regular rate and rhythm. Abdomen:  Soft, nontender and nondistended. Normal bowel sounds, without guarding, and without rebound.   Neurologic:  Alert and  oriented x4;  grossly normal neurologically.  Impression/Plan:     SCREENING  Plan:  1. TCS TODAY DISCUSSED PROCEDURE, BENEFITS, & RISKS: < 1% chance of medication reaction, bleeding, perforation, or rupture of spleen/liver.

## 2017-08-31 NOTE — Op Note (Addendum)
[facility] Patient Name: [name] Procedure Date: 08/31/2017 11:04 AM MRN: [id number] Date of Birth: 07/03/1961 Attending MD: Barney Drain MD, MD CSN: [id number] Age: 56 Admit Type: Outpatient Procedure:                Colonoscopy WITH COLD SNARE/SNARE CAUTERY                            POLYPECTOMY Indications:              Screening for colorectal malignant neoplasm Providers:                Barney Drain MD, MD, Janeece Riggers, RN, Selena Lesser, Randa Spike, Technician Referring MD:             [employer], MD Medicines:                Meperidine 75 mg IV, Midazolam 4 mg IV, HYDRALAZINE                            10 MG IV Complications:            No immediate complications. HYPOXIA AND ELEVATED BP                            DURING TCS. Estimated Blood Loss:     Estimated blood loss was minimal. Procedure:                Pre-Anesthesia Assessment:                           - Prior to the procedure, a History and Physical                            was performed, and patient medications and                            allergies were reviewed. The patient's tolerance of                            previous anesthesia was also reviewed. The risks                            and benefits of the procedure and the sedation                            options and risks were discussed with the patient.                            All questions were answered, and informed consent                            was obtained. Prior Anticoagulants: The patient has  taken aspirin, last dose was 3 days prior to                            procedure. ASA Grade Assessment: II - A patient                            with mild systemic disease. After reviewing the                            risks and benefits, the patient was deemed in                            satisfactory condition to undergo the procedure.                            After  obtaining informed consent, the colonoscope                            was passed under direct vision. Throughout the                            procedure, the patient's blood pressure, pulse, and                            oxygen saturations were monitored continuously. The                            EC-[id number] ([id number]) scope was introduced through                            the anus and advanced to the the cecum, identified                            by appendiceal orifice and ileocecal valve. The                            colonoscopy was somewhat difficult due to a                            tortuous colon, the patient's cardiovascular                            instability (hypertension) and the patient's oxygen                            desaturation. Successful completion of the                            procedure was aided by increasing the dose of                            sedation medication, straightening and shortening  the scope to obtain bowel loop reduction, managing                            the patient's medical instability, performing chin                            lift, administering oxygen and COLOWRAP. The                            patient tolerated the procedure fairly well. The                            quality of the bowel preparation was good. The                            ileocecal valve, appendiceal orifice, and rectum                            were photographed. Scope In: 11:17:36 AM Scope Out: 11:34:17 AM Scope Withdrawal Time: 0 hours 14 minutes 51 seconds  Total Procedure Duration: 0 hours 16 minutes 41 seconds  Findings:      Two sessile polyps were found in the ascending colon. The polyps were 3       to 5 mm in size. These polyps were removed with a cold snare. Resection       and retrieval were complete.      A 7 mm polyp was found in the hepatic flexure. The polyp was sessile.       The polyp was removed with a hot  snare. Resection and retrieval were       complete.      External hemorrhoids were found during retroflexion. The hemorrhoids       were moderate.      The recto-sigmoid colon and sigmoid colon were mildly redundant. Impression:               - Two 3 to 5 mm polyps in the ascending colon,                            removed with a cold snare. Resected and retrieved.                           - One 7 mm polyp at the hepatic flexure, removed                            with a hot snare. Resected and retrieved.                           - External hemorrhoids.                           - Redundant LEFT colon. Moderate Sedation:      Moderate (conscious) sedation was administered by the endoscopy nurse       and supervised by the endoscopist. The following parameters were       monitored: oxygen saturation, heart rate, blood pressure, and response  to care. Total physician intraservice time was 24 minutes. Recommendation:           - High fiber diet. CONTINUE WEIGHT LOSS EFFORTS.                           - Continue present medications.                           - Await pathology results.                           - Repeat colonoscopy in 3 years for surveillance                            WITH CPAP AND COLOWRAP.                           - Refer to [place] at the next                            available appointment.                           - Patient has a contact number available for                            emergencies. The signs and symptoms of potential                            delayed complications were discussed with the                            patient. Return to normal activities tomorrow.                            Written discharge instructions were provided to the                            patient. Procedure Code(s):        --- Professional ---                           [phone], Colonoscopy, flexible; with removal of                             tumor(s), polyp(s), or other lesion(s) by snare                            technique                           99152, Moderate sedation services provided by the                            same physician or other qualified health care  professional performing the diagnostic or                            therapeutic service that the sedation supports,                            requiring the presence of an independent trained                            observer to assist in the monitoring of the                            patient's level of consciousness and physiological                            status; initial 15 minutes of intraservice time,                            patient age 59 years or older                           [phone], Moderate sedation services; each additional                            15 minutes intraservice time Diagnosis Code(s):        --- Professional ---                           Z12.11, Encounter for screening for malignant                            neoplasm of colon                           D12.2, Benign neoplasm of ascending colon                           D12.3, Benign neoplasm of transverse colon (hepatic                            flexure or splenic flexure)                           K64.4, Residual hemorrhoidal skin tags                           Q43.8, Other specified congenital malformations of                            intestine CPT copyright 2016 American Medical Association. All rights reserved. The codes documented in this report are preliminary and upon coder review may  be revised to meet current compliance requirements. Barney Drain, MD Barney Drain MD, MD 08/31/2017 11:48:47 AM This report has been signed electronically. Number of Addenda: 0

## 2017-08-31 NOTE — Telephone Encounter (Signed)
Referral faxed to Dr. Luan Pulling Office

## 2017-09-01 ENCOUNTER — Encounter: Payer: Self-pay | Admitting: *Deleted

## 2017-09-01 NOTE — Telephone Encounter (Signed)
LMOVM

## 2017-09-01 NOTE — Telephone Encounter (Signed)
Appt letter mailed to patient with details

## 2017-09-02 ENCOUNTER — Telehealth: Payer: Self-pay | Admitting: Gastroenterology

## 2017-09-02 NOTE — Telephone Encounter (Signed)
Pt was returning a call to [place]. Please call him back at [phone]

## 2017-09-02 NOTE — Telephone Encounter (Signed)
Called patient and informed him of his appt date/time/location w/ Dr. Luan Pulling for sleep apnea eval. He verbalized understanding and needed nothing further. Also aware letter was mailed to him

## 2017-09-03 ENCOUNTER — Encounter (HOSPITAL_COMMUNITY): Payer: Self-pay | Admitting: Gastroenterology

## 2017-09-16 ENCOUNTER — Telehealth: Payer: Self-pay | Admitting: Gastroenterology

## 2017-09-16 NOTE — Telephone Encounter (Signed)
Please call pt. HE had THREE simple adenomas removed. FOLLOW A HIGH FIBER DIET. NEXT TCS IN 3 YEARS.

## 2017-09-16 NOTE — Discharge Instructions (Signed)
You have small internal AND MODERATE EXTERNAL HEMORRHOIDS. YOU HAD THREE POLYPS REMOVED.   TAKE YOUR BLOOD PRESSURE MEDS WHEN YOU GET HOME.  DRINK WATER TO KEEP YOUR URINE LIGHT YELLOW.  FOLLOW A HIGH FIBER DIET. AVOID ITEMS THAT CAUSE BLOATING. See info below.CONSIDER A PLANT BASED DIET-NO MEAT OR DAIRY for 6 MOS. I RECOMMEND THE BOOK, "PREVENT AND REVERSE HEART DISEASE", CALDWELL ESSELSTYN JR., MD. PAGES 120-121 Dickinson THE DIET AND THE LAST HALF OF THE BOOK HAS QUICK AND EASY RECIPES FOR BREAKFAST, LUNCH, AND DINNER ARE AFTER PAGE 127.   CONTINUE YOUR WEIGHT LOSS EFFORTS.  WHILE I DO NOT WANT TO ALARM YOU, YOUR BODY MASS INDEX (BMI) IS OVER 30 WHICH MEANS YOU ARE OBESE. OBESITY ACTIVATES CANCER GENES. OBESITY IS ASSOCIATED WITH AN INCREASED RISK FOR CIRRHOSIS AND ALL CANCERS, INCLUDING ESOPHAGEAL AND COLON CANCER. A WEIGHT OF   WILL GET YOUR BMI UNDER 30. A WEIGHT OF 210 LBS  WILL GET YOUR BODY MASS INDEX(BMI) UNDER 30.   YOUR BIOPSY RESULTS WILL BE AVAILABLE IN MY CHART AFTER OCT 26 AND MY OFFICE WILL CONTACT YOU IN 10-14 DAYS WITH YOUR RESULTS.   YOU SHOULD SEE NEUROLOGY WITHIN THE NEXT 1-2 MOS TO BE EVALUATED FOR SLEEP APNEA.  Next colonoscopy in 3 years.  Colonoscopy Care After Read the instructions outlined below and refer to this sheet in the next week. These discharge instructions provide you with general information on caring for yourself after you leave the hospital. While your treatment has been planned according to the most current medical practices available, unavoidable complications occasionally occur. If you have any problems or questions after discharge, call DR. Justyce Yeater, [phone].  ACTIVITY  You may resume your regular activity, but move at a slower pace for the next 24 hours.   Take frequent rest periods for the next 24 hours.   Walking will help get rid of the air and reduce the bloated feeling in your belly (abdomen).   No driving for 24  hours (because of the medicine (anesthesia) used during the test).   You may shower.   Do not sign any important legal documents or operate any machinery for 24 hours (because of the anesthesia used during the test).    NUTRITION  Drink plenty of fluids.   You may resume your normal diet as instructed by your doctor.   Begin with a light meal and progress to your normal diet. Heavy or fried foods are harder to digest and may make you feel sick to your stomach (nauseated).   Avoid alcoholic beverages for 24 hours or as instructed.    MEDICATIONS  You may resume your normal medications.   WHAT YOU CAN EXPECT TODAY  Some feelings of bloating in the abdomen.   Passage of more gas than usual.   Spotting of blood in your stool or on the toilet paper  .  IF YOU HAD POLYPS REMOVED DURING THE COLONOSCOPY:  Eat a soft diet IF YOU HAVE NAUSEA, BLOATING, ABDOMINAL PAIN, OR VOMITING.    FINDING OUT THE RESULTS OF YOUR TEST Not all test results are available during your visit. DR. Oneida Alar WILL CALL YOU WITHIN 14 DAYS OF YOUR PROCEDUE WITH YOUR RESULTS. Do not assume everything is normal if you have not heard from DR. Kattie Santoyo, CALL HER OFFICE AT [phone].  SEEK IMMEDIATE MEDICAL ATTENTION AND CALL THE OFFICE: [phone] IF:  You have more than a spotting of blood in your stool.   Your  belly is swollen (abdominal distention).   You are nauseated or vomiting.   You have a temperature over 101F.   You have abdominal pain or discomfort that is severe or gets worse throughout the day.  High-Fiber Diet A high-fiber diet changes your normal diet to include more whole grains, legumes, fruits, and vegetables. Changes in the diet involve replacing refined carbohydrates with unrefined foods. The calorie level of the diet is essentially unchanged. The Dietary Reference Intake (recommended amount) for adult males is 38 grams per day. For adult females, it is 25 grams per day. Pregnant  and lactating women should consume 28 grams of fiber per day.Fiber is the intact part of a plant that is not broken down during digestion. Functional fiber is fiber that has been isolated from the plant to provide a beneficial effect in the body.  PURPOSE  Increase stool bulk.   Ease and regulate bowel movements.   Lower cholesterol.   REDUCE RISK OF COLON CANCER  INDICATIONS THAT YOU NEED MORE FIBER  Constipation and hemorrhoids.   Uncomplicated diverticulosis (intestine condition) and irritable bowel syndrome.   Weight management.   As a protective measure against hardening of the arteries (atherosclerosis), diabetes, and cancer.   GUIDELINES FOR INCREASING FIBER IN THE DIET  Start adding fiber to the diet slowly. A gradual increase of about 5 more grams (2 slices of whole-wheat bread, 2 servings of most fruits or vegetables, or 1 bowl of high-fiber cereal) per day is best. Too rapid an increase in fiber may result in constipation, flatulence, and bloating.   Drink enough water and fluids to keep your urine clear or pale yellow. Water, juice, or caffeine-free drinks are recommended. Not drinking enough fluid may cause constipation.   Eat a variety of high-fiber foods rather than one type of fiber.   Try to increase your intake of fiber through using high-fiber foods rather than fiber pills or supplements that contain small amounts of fiber.   The goal is to change the types of food eaten. Do not supplement your present diet with high-fiber foods, but replace foods in your present diet.   INCLUDE A VARIETY OF FIBER SOURCES  Replace refined and processed grains with whole grains, canned fruits with fresh fruits, and incorporate other fiber sources. White rice, white breads, and most bakery goods contain little or no fiber.   Brown whole-grain rice, buckwheat oats, and many fruits and vegetables are all good sources of fiber. These include: broccoli, Brussels sprouts, cabbage,  cauliflower, beets, sweet potatoes, white potatoes (skin on), carrots, tomatoes, eggplant, squash, berries, fresh fruits, and dried fruits.   Cereals appear to be the richest source of fiber. Cereal fiber is found in whole grains and bran. Bran is the fiber-rich outer coat of cereal grain, which is largely removed in refining. In whole-grain cereals, the bran remains. In breakfast cereals, the largest amount of fiber is found in those with "bran" in their names. The fiber content is sometimes indicated on the label.   You may need to include additional fruits and vegetables each day.   In baking, for 1 cup white flour, you may use the following substitutions:   1 cup whole-wheat flour minus 2 tablespoons.   1/2 cup white flour plus 1/2 cup whole-wheat flour.   Polyps, Colon  A polyp is extra tissue that grows inside your body. Colon polyps grow in the large intestine. The large intestine, also called the colon, is part of your digestive system. It is  a long, hollow tube at the end of your digestive tract where your body makes and stores stool. Most polyps are not dangerous. They are benign. This means they are not cancerous. But over time, some types of polyps can turn into cancer. Polyps that are smaller than a pea are usually not harmful. But larger polyps could someday become or may already be cancerous. To be safe, doctors remove all polyps and test them.   WHO GETS POLYPS? Anyone can get polyps, but certain people are more likely than others. You may have a greater chance of getting polyps if:  You are over 50.   You have had polyps before.   Someone in your family has had polyps.   Someone in your family has had cancer of the large intestine.   Find out if someone in your family has had polyps. You may also be more likely to get polyps if you:   Eat a lot of fatty foods   Smoke   Drink alcohol   Do not exercise  Eat too much     PREVENTION There is not one sure way to  prevent polyps. You might be able to lower your risk of getting them if you:  Eat more fruits and vegetables and less fatty food.   Do not smoke.   Avoid alcohol.   Exercise every day.   Lose weight if you are overweight.   Eating more calcium and folate can also lower your risk of getting polyps. Some foods that are rich in calcium are milk, cheese, and broccoli. Some foods that are rich in folate are chickpeas, kidney beans, and spinach.     Hemorrhoids Hemorrhoids are dilated (enlarged) veins around the rectum. Sometimes clots will form in the veins. This makes them swollen and painful. These are called thrombosed hemorrhoids. Causes of hemorrhoids include:  Constipation.   Straining to have a bowel movement.   HEAVY LIFTING  HOME CARE INSTRUCTIONS  Eat a well balanced diet and drink 6 to 8 glasses of water every day to avoid constipation. You may also use a bulk laxative.   Avoid straining to have bowel movements.   Keep anal area dry and clean.   Do not use a donut shaped pillow or sit on the toilet for long periods. This increases blood pooling and pain.   Move your bowels when your body has the urge; this will require less straining and will decrease pain and pressure.

## 2017-09-17 NOTE — Telephone Encounter (Signed)
Reminder in epic °

## 2017-09-17 NOTE — Telephone Encounter (Signed)
Pt is aware.  

## 2017-09-25 ENCOUNTER — Ambulatory Visit: Payer: Commercial Managed Care - PPO | Attending: Pulmonary Disease | Admitting: Neurology

## 2017-09-25 DIAGNOSIS — G4733 Obstructive sleep apnea (adult) (pediatric): Secondary | ICD-10-CM | POA: Insufficient documentation

## 2017-10-06 DIAGNOSIS — J069 Acute upper respiratory infection, unspecified: Secondary | ICD-10-CM | POA: Diagnosis not present

## 2017-10-10 NOTE — Procedures (Signed)
[place] A. Merlene Laughter, MD     [email or web address]             NOCTURNAL POLYSOMNOGRAPHY   LOCATION: [facility]   Patient Name: [name], [name] Date: 09/25/2017 Gender: Male D.O.B: January 01, 1961 Age (years): 56 Referring Provider: Sinda Du Height (inches): 89 Interpreting Physician: Phillips Odor MD, ABSM Weight (lbs): 245 RPSGT: Peak, Robert BMI: 35 MRN: [id number] Neck Size: 17.50 CLINICAL INFORMATION Sleep Study Type: Split Night CPAP  Indication for sleep study: OSA  Epworth Sleepiness Score: 8  SLEEP STUDY TECHNIQUE As per the AASM Manual for the Scoring of Sleep and Associated Events v2.3 (April 2016) with a hypopnea requiring 4% desaturations.  The channels recorded and monitored were frontal, central and occipital EEG, electrooculogram (EOG), submentalis EMG (chin), nasal and oral airflow, thoracic and abdominal wall motion, anterior tibialis EMG, snore microphone, electrocardiogram, and pulse oximetry. Continuous positive airway pressure (CPAP) was initiated when the patient met split night criteria and was titrated according to treat sleep-disordered breathing.  MEDICATIONS Medications self-administered by patient taken the night of the study : N/A  Current Outpatient Medications:  .  amLODipine-valsartan (EXFORGE) 10-160 MG tablet, Take 1 tablet by mouth daily., Disp: , Rfl:  .  aspirin EC 81 MG tablet, Take 81 mg by mouth daily., Disp: , Rfl:  .  furosemide (LASIX) 20 MG tablet, Take 1 tablet by mouth daily., Disp: , Rfl: 4 .  hydrochlorothiazide (HYDRODIURIL) 25 MG tablet, Take 25 mg by mouth daily., Disp: , Rfl:  .  insulin glargine (LANTUS) 100 UNIT/ML injection, Inject 61 Units into the skin every morning. , Disp: , Rfl:  .  lansoprazole (PREVACID) 15 MG capsule, Take 15 mg by mouth daily. , Disp: , Rfl:  .  metFORMIN (GLUCOPHAGE) 500 MG tablet, Take 500-1,000 mg by mouth See admin instructions. (2500 mg daily) 1000 mg  am   500 mg lunch 1000 mg pm, Disp: , Rfl:  .  Multiple Vitamins-Minerals (MULTIVITAMIN WITH MINERALS) tablet, Take 1 tablet by mouth daily., Disp: , Rfl:  .  simvastatin (ZOCOR) 20 MG tablet, Take 20 mg by mouth daily., Disp: , Rfl:   RESPIRATORY PARAMETERS Diagnostic  Total AHI (/hr): 35.7 RDI (/hr): 35.7 OA Index (/hr): 3.2 CA Index (/hr): 0.0 REM AHI (/hr): 37.2 NREM AHI (/hr): 35.4 Supine AHI (/hr): N/A Non-supine AHI (/hr): 35.72 Min O2 Sat (%): 78.00 Mean O2 (%): 89.59 Time below 88% (min): 33.9   Titration  Optimal Pressure (cm): 12 AHI at Optimal Pressure (/hr): 3.3 Min O2 at Optimal Pressure (%): 89.0 Supine % at Optimal (%): 23 Sleep % at Optimal (%): 100   SLEEP ARCHITECTURE The recording time for the entire night was 413.6 minutes.  During a baseline period of 161.9 minutes, the patient slept for 149.5 minutes in REM and nonREM, yielding a sleep efficiency of 92.3%. Sleep onset after lights out was 10.6 minutes with a REM latency of 82.0 minutes. The patient spent 8.36% of the night in stage N1 sleep, 65.89% in stage N2 sleep, 6.35% in stage N3 and 19.40% in REM.  During the titration period of 243.0 minutes, the patient slept for 206.5 minutes in REM and nonREM, yielding a sleep efficiency of 85.0%. Sleep onset after CPAP initiation was 8.5 minutes with a REM latency of 57.5 minutes. The patient spent 15.01% of the night in stage N1 sleep, 53.76% in stage N2 sleep, 5.33% in stage N3 and 25.90% in REM.  CARDIAC DATA The 2 lead EKG demonstrated  sinus rhythm. The mean heart rate was 77.51 beats per minute. Other EKG findings include: None. LEG MOVEMENT DATA The total Periodic Limb Movements of Sleep (PLMS) were 120. The PLMS index was 20.22.  IMPRESSIONS Moderately severe obstructive sleep apnea occurred during the diagnostic portion of the study (AHI = 35.7/hour). The optimal CPAP selected for this patient is ( 12 cm of water).  Delano Metz, MD Diplomate, American Board of  Sleep Medicine. ELECTRONICALLY SIGNED ON:  10/10/2017, 4:36 PM [place] PH: [phone]   FX: [phone] [place]

## 2017-10-12 DIAGNOSIS — G4730 Sleep apnea, unspecified: Secondary | ICD-10-CM | POA: Diagnosis not present

## 2017-10-12 DIAGNOSIS — I10 Essential (primary) hypertension: Secondary | ICD-10-CM | POA: Diagnosis not present

## 2017-10-12 DIAGNOSIS — E119 Type 2 diabetes mellitus without complications: Secondary | ICD-10-CM | POA: Diagnosis not present

## 2017-10-19 ENCOUNTER — Inpatient Hospital Stay
Admission: RE | Admit: 2017-10-19 | Discharge: 2017-10-19 | Disposition: A | Discharge status: Home / Self Care | Payer: Commercial Managed Care - PPO | Source: Ambulatory Visit | Attending: General Surgery | Admitting: General Surgery

## 2017-10-19 ENCOUNTER — Inpatient Hospital Stay: Admission: RE | Admit: 2017-10-19 | Payer: Commercial Managed Care - PPO | Source: Ambulatory Visit

## 2017-10-26 ENCOUNTER — Ambulatory Visit
Admission: RE | Admit: 2017-10-26 | Discharge: 2017-10-26 | Disposition: A | Discharge status: Home / Self Care | Payer: Commercial Managed Care - PPO | Source: Ambulatory Visit | Attending: General Surgery | Admitting: General Surgery

## 2017-10-26 ENCOUNTER — Inpatient Hospital Stay
Admission: RE | Admit: 2017-10-26 | Discharge: 2017-10-26 | Disposition: A | Discharge status: Home / Self Care | Payer: Commercial Managed Care - PPO | Source: Ambulatory Visit | Attending: General Surgery | Admitting: General Surgery

## 2017-10-26 ENCOUNTER — Other Ambulatory Visit: Payer: Commercial Managed Care - PPO

## 2017-10-26 DIAGNOSIS — D3A8 Other benign neuroendocrine tumors: Secondary | ICD-10-CM | POA: Diagnosis not present

## 2017-10-26 DIAGNOSIS — D3500 Benign neoplasm of unspecified adrenal gland: Secondary | ICD-10-CM | POA: Diagnosis not present

## 2017-10-26 DIAGNOSIS — K8689 Other specified diseases of pancreas: Secondary | ICD-10-CM

## 2017-10-26 DIAGNOSIS — C7A8 Other malignant neuroendocrine tumors: Secondary | ICD-10-CM | POA: Diagnosis not present

## 2017-12-24 DIAGNOSIS — Z1389 Encounter for screening for other disorder: Secondary | ICD-10-CM | POA: Diagnosis not present

## 2017-12-24 DIAGNOSIS — R51 Headache: Secondary | ICD-10-CM | POA: Diagnosis not present

## 2017-12-24 DIAGNOSIS — J329 Chronic sinusitis, unspecified: Secondary | ICD-10-CM | POA: Diagnosis not present

## 2017-12-24 DIAGNOSIS — E1165 Type 2 diabetes mellitus with hyperglycemia: Secondary | ICD-10-CM | POA: Diagnosis not present

## 2017-12-24 DIAGNOSIS — M353 Polymyalgia rheumatica: Secondary | ICD-10-CM | POA: Diagnosis not present

## 2018-04-08 DIAGNOSIS — I10 Essential (primary) hypertension: Secondary | ICD-10-CM | POA: Diagnosis not present

## 2018-04-08 DIAGNOSIS — E6609 Other obesity due to excess calories: Secondary | ICD-10-CM | POA: Diagnosis not present

## 2018-04-08 DIAGNOSIS — K219 Gastro-esophageal reflux disease without esophagitis: Secondary | ICD-10-CM | POA: Diagnosis not present

## 2018-04-08 DIAGNOSIS — E119 Type 2 diabetes mellitus without complications: Secondary | ICD-10-CM | POA: Diagnosis not present

## 2018-04-08 DIAGNOSIS — Z1389 Encounter for screening for other disorder: Secondary | ICD-10-CM | POA: Diagnosis not present

## 2018-04-08 DIAGNOSIS — G4730 Sleep apnea, unspecified: Secondary | ICD-10-CM | POA: Diagnosis not present

## 2018-04-08 DIAGNOSIS — Z6835 Body mass index (BMI) 35.0-35.9, adult: Secondary | ICD-10-CM | POA: Diagnosis not present

## 2018-04-17 ENCOUNTER — Other Ambulatory Visit: Payer: Self-pay | Admitting: Nurse Practitioner

## 2018-04-17 DIAGNOSIS — K8689 Other specified diseases of pancreas: Secondary | ICD-10-CM

## 2018-04-28 NOTE — Progress Notes (Signed)
Cardiology Office Note   Date:  04/28/2018   ID:  [name], DOB [birth year], MRN [id number]  PCP:  Redmond School, MD  Cardiologist:   Jenkins Rouge, MD   No chief complaint on file.     History of Present Illness: [name] is a 57 y.o. male who presents for consultation regarding palpitations CRF;s include DM , HTN and HLD.  Family history also positive with mom having CABG. A couple of months ago he had funny feeling in chest with palpitations. Not really pain no associated diaphoresis or dyspnea. Lasted a few hours and went away. He works for a [employer]. Has some dependent edema but no other symptoms Diet poor and DM not well controlled Had a heart cath over a decade ago with no critical CAD. Compliant with meds Follows routine diabetic Care including seeing eye doctor   Labs reviewed from 04/08/18 Hct 38.8 Cr .78 K 4.1 LDL 28 TSH 1.3 A1c 7.1     Past Medical History:  Diagnosis Date  . Diabetes mellitus without complication ([place])   . foot bone spur   . GERD (gastroesophageal reflux disease)   . Hyperlipidemia   . Hypertension   . Small bowel obstruction [facility])     Past Surgical History:  Procedure Laterality Date  . COLONOSCOPY  2007   [facility]- sigmoid colon polyp and TI lymphoid hypoplasia but o/w no evidence of crohn's disease, no bx report available.   . COLONOSCOPY N/A 08/31/2017   Procedure: COLONOSCOPY;  Surgeon: Danie Binder, MD;  Location: [facility];  Service: Endoscopy;  Laterality: N/A;  1030   . FOOT SURGERY    . GIVENS CAPSULE STUDY  10/12/2006   there was no evidence of active GI bleeding, inflammation or erosions on this study  . HERNIA REPAIR     as a child  . Pancreatic cancer surgery    . TONSILLECTOMY       Current Outpatient Medications  Medication Sig Dispense Refill  . amLODipine-valsartan (EXFORGE) 10-160 MG tablet Take 1 tablet by mouth daily.    Marland Kitchen aspirin EC 81 MG tablet Take 81 mg by mouth daily.    .  furosemide (LASIX) 20 MG tablet Take 1 tablet by mouth daily.  4  . hydrochlorothiazide (HYDRODIURIL) 25 MG tablet Take 25 mg by mouth daily.    . insulin glargine (LANTUS) 100 UNIT/ML injection Inject 61 Units into the skin every morning.     . lansoprazole (PREVACID) 15 MG capsule Take 15 mg by mouth daily.     . metFORMIN (GLUCOPHAGE) 500 MG tablet Take 500-1,000 mg by mouth See admin instructions. (2500 mg daily) 1000 mg  am  500 mg lunch 1000 mg pm    . Multiple Vitamins-Minerals (MULTIVITAMIN WITH MINERALS) tablet Take 1 tablet by mouth daily.    . simvastatin (ZOCOR) 20 MG tablet Take 20 mg by mouth daily.     No current facility-administered medications for this visit.     Allergies:   Cortisone and Penicillins    Social History:  The patient  reports that he has never smoked. His smokeless tobacco use includes snuff. He reports that he drinks about 0.6 oz of alcohol per week. He reports that he does not use drugs.   Family History:  The patient's family history includes CAD in his mother; Diabetes in his mother; Hypertension in his mother; Stroke in his mother.    ROS:  Please see the history of  present illness.   Otherwise, review of systems are positive for none.   All other systems are reviewed and negative.    PHYSICAL EXAM: VS:  There were no vitals taken for this visit. , BMI There is no height or weight on file to calculate BMI. Affect appropriate Overweight white male  HEENT: normal Neck supple with no adenopathy JVP normal no bruits no thyromegaly Lungs clear with no wheezing and good diaphragmatic motion Heart:  S1/S2 no murmur, no rub, gallop or click PMI normal Abdomen: benighn, BS positve, no tenderness, no AAA no bruit.  No HSM or HJR Distal pulses intact with no bruits No edema Neuro non-focal Skin warm and dry No muscular weakness    EKG:  NSR rate 72 normal    Recent Labs: No results found for requested labs within last 8760 hours.     Lipid Panel No results found for: CHOL, TRIG, HDL, CHOLHDL, VLDL, LDLCALC, LDLDIRECT    Wt Readings from Last 3 Encounters:  08/31/17 240 lb (108.9 kg)  04/01/16 249 lb (112.9 kg)  02/25/16 247 lb (112 kg)      Other studies Reviewed: Additional studies/ records that were reviewed today include: Notes, labs , ECG from primary .    ASSESSMENT AND PLAN:  1.  Palpitations benign no need for monitor not recurrent  2. HTN:  Well controlled.  Continue current medications and low sodium Dash type diet.   3. HLD:  Continue statin labs with primary  4. DM:  Discussed low carb diet.  Target hemoglobin A1c is 6.5 or less.  Continue current medications. Given risk factors and "funny feeling " in chest with palpitations will order exercise myovue to r/o CAD   Current medicines are reviewed at length with the patient today.  The patient does not have concerns regarding medicines.  The following changes have been made:  no change  Labs/ tests ordered today include: Event monitor TTE  No orders of the defined types were placed in this encounter.    Disposition:   FU with cardiology PRN      Signed, Jenkins Rouge, MD  04/28/2018 12:16 PM    [facility] [place], [place], [place]  [address] Phone: [phone]; Fax: [phone]

## 2018-04-29 ENCOUNTER — Ambulatory Visit: Payer: Commercial Managed Care - PPO | Admitting: Cardiovascular Disease

## 2018-04-29 ENCOUNTER — Encounter: Payer: Self-pay | Admitting: Cardiovascular Disease

## 2018-04-29 DIAGNOSIS — R002 Palpitations: Secondary | ICD-10-CM | POA: Diagnosis not present

## 2018-04-29 DIAGNOSIS — R079 Chest pain, unspecified: Secondary | ICD-10-CM

## 2018-04-29 NOTE — Patient Instructions (Signed)
Medication Instructions:  Your physician recommends that you continue on your current medications as directed. Please refer to the Current Medication list given to you today.   Labwork: none  Testing/Procedures: Your physician has requested that you have en exercise stress myoview. For further information please visit [email or web address]. Please follow instruction sheet, as given.    Follow-Up: Your physician recommends that you schedule a follow-up appointment in: as needed, will call with test results     Any Other Special Instructions Will Be Listed Below (If Applicable).     If you need a refill on your cardiac medications before your next appointment, please call your pharmacy.   

## 2018-04-30 ENCOUNTER — Encounter (HOSPITAL_COMMUNITY)
Admission: RE | Admit: 2018-04-30 | Discharge: 2018-04-30 | Disposition: A | Discharge status: Home / Self Care | Payer: Commercial Managed Care - PPO | Source: Ambulatory Visit | Attending: Cardiovascular Disease | Admitting: Cardiovascular Disease

## 2018-04-30 ENCOUNTER — Encounter (HOSPITAL_BASED_OUTPATIENT_CLINIC_OR_DEPARTMENT_OTHER)
Admission: RE | Admit: 2018-04-30 | Discharge: 2018-04-30 | Disposition: A | Discharge status: Home / Self Care | Payer: Commercial Managed Care - PPO | Source: Ambulatory Visit | Attending: Cardiovascular Disease | Admitting: Cardiovascular Disease

## 2018-04-30 ENCOUNTER — Encounter (HOSPITAL_COMMUNITY): Payer: Self-pay

## 2018-04-30 DIAGNOSIS — R079 Chest pain, unspecified: Secondary | ICD-10-CM | POA: Diagnosis not present

## 2018-04-30 DIAGNOSIS — R002 Palpitations: Secondary | ICD-10-CM | POA: Diagnosis not present

## 2018-04-30 LAB — NM MYOCAR MULTI W/SPECT W/WALL MOTION / EF
CHL CUP NUCLEAR SDS: 0
CHL CUP NUCLEAR SSS: 4
CSEPED: 8 min
CSEPPHR: 153 {beats}/min
Estimated workload: 10.1 METS
Exercise duration (sec): 0 s
LHR: 0.33
LV dias vol: 76 mL (ref 62–150)
LV sys vol: 23 mL
MPHR: 162 {beats}/min
Percent HR: 94 %
RPE: 13
Rest HR: 71 {beats}/min
SRS: 4
TID: 0.93

## 2018-04-30 MED ORDER — TECHNETIUM TC 99M TETROFOSMIN IV KIT
30.0000 | PACK | Freq: Once | INTRAVENOUS | Status: AC | PRN
  Administered 2018-04-30: 27.9 via INTRAVENOUS

## 2018-04-30 MED ORDER — REGADENOSON 0.4 MG/5ML IV SOLN
INTRAVENOUS | Status: AC
  Filled 2018-04-30: qty 5

## 2018-04-30 MED ORDER — TECHNETIUM TC 99M TETROFOSMIN IV KIT
10.0000 | PACK | Freq: Once | INTRAVENOUS | Status: AC | PRN
  Administered 2018-04-30: 9.6 via INTRAVENOUS

## 2018-04-30 MED ORDER — SODIUM CHLORIDE 0.9% FLUSH
INTRAVENOUS | Status: AC
  Administered 2018-04-30: 10 mL via INTRAVENOUS
  Filled 2018-04-30: qty 10

## 2018-05-03 ENCOUNTER — Ambulatory Visit
Admission: RE | Admit: 2018-05-03 | Discharge: 2018-05-03 | Disposition: A | Discharge status: Home / Self Care | Payer: Commercial Managed Care - PPO | Source: Ambulatory Visit | Attending: Nurse Practitioner | Admitting: Nurse Practitioner

## 2018-05-03 DIAGNOSIS — C254 Malignant neoplasm of endocrine pancreas: Secondary | ICD-10-CM | POA: Diagnosis not present

## 2018-05-03 DIAGNOSIS — K8689 Other specified diseases of pancreas: Secondary | ICD-10-CM

## 2018-05-03 DIAGNOSIS — C7A8 Other malignant neuroendocrine tumors: Secondary | ICD-10-CM | POA: Diagnosis not present

## 2018-05-03 MED ORDER — IOPAMIDOL (ISOVUE-300) INJECTION 61%
125.0000 mL | Freq: Once | INTRAVENOUS | Status: AC | PRN
  Administered 2018-05-03: 125 mL via INTRAVENOUS

## 2018-05-10 DIAGNOSIS — C7A8 Other malignant neuroendocrine tumors: Secondary | ICD-10-CM | POA: Diagnosis not present

## 2018-06-15 DIAGNOSIS — L0390 Cellulitis, unspecified: Secondary | ICD-10-CM | POA: Diagnosis not present

## 2018-06-15 DIAGNOSIS — Z1389 Encounter for screening for other disorder: Secondary | ICD-10-CM | POA: Diagnosis not present

## 2018-06-15 DIAGNOSIS — R609 Edema, unspecified: Secondary | ICD-10-CM | POA: Diagnosis not present

## 2018-06-15 DIAGNOSIS — E6609 Other obesity due to excess calories: Secondary | ICD-10-CM | POA: Diagnosis not present

## 2018-11-30 DIAGNOSIS — J019 Acute sinusitis, unspecified: Secondary | ICD-10-CM | POA: Diagnosis not present

## 2018-12-07 DIAGNOSIS — N529 Male erectile dysfunction, unspecified: Secondary | ICD-10-CM | POA: Diagnosis not present

## 2018-12-07 DIAGNOSIS — Z1389 Encounter for screening for other disorder: Secondary | ICD-10-CM | POA: Diagnosis not present

## 2018-12-07 DIAGNOSIS — E1165 Type 2 diabetes mellitus with hyperglycemia: Secondary | ICD-10-CM | POA: Diagnosis not present

## 2018-12-07 DIAGNOSIS — R5383 Other fatigue: Secondary | ICD-10-CM | POA: Diagnosis not present

## 2018-12-07 DIAGNOSIS — K219 Gastro-esophageal reflux disease without esophagitis: Secondary | ICD-10-CM | POA: Diagnosis not present

## 2018-12-07 DIAGNOSIS — E6609 Other obesity due to excess calories: Secondary | ICD-10-CM | POA: Diagnosis not present

## 2018-12-07 DIAGNOSIS — I700 Atherosclerosis of aorta: Secondary | ICD-10-CM | POA: Diagnosis not present

## 2018-12-07 DIAGNOSIS — Z6834 Body mass index (BMI) 34.0-34.9, adult: Secondary | ICD-10-CM | POA: Diagnosis not present

## 2018-12-07 DIAGNOSIS — Z0001 Encounter for general adult medical examination with abnormal findings: Secondary | ICD-10-CM | POA: Diagnosis not present

## 2018-12-07 DIAGNOSIS — M2140 Flat foot [pes planus] (acquired), unspecified foot: Secondary | ICD-10-CM | POA: Diagnosis not present

## 2018-12-07 DIAGNOSIS — N400 Enlarged prostate without lower urinary tract symptoms: Secondary | ICD-10-CM | POA: Diagnosis not present

## 2018-12-15 DIAGNOSIS — R5383 Other fatigue: Secondary | ICD-10-CM | POA: Diagnosis not present

## 2018-12-17 ENCOUNTER — Other Ambulatory Visit (HOSPITAL_COMMUNITY): Payer: Self-pay | Admitting: Internal Medicine

## 2018-12-17 ENCOUNTER — Encounter (HOSPITAL_COMMUNITY): Payer: Self-pay

## 2018-12-17 ENCOUNTER — Ambulatory Visit (HOSPITAL_COMMUNITY)
Admission: RE | Admit: 2018-12-17 | Discharge: 2018-12-17 | Disposition: A | Discharge status: Home / Self Care | Payer: Commercial Managed Care - PPO | Source: Ambulatory Visit | Attending: Internal Medicine | Admitting: Internal Medicine

## 2018-12-17 DIAGNOSIS — R059 Cough, unspecified: Secondary | ICD-10-CM

## 2018-12-17 DIAGNOSIS — R05 Cough: Secondary | ICD-10-CM | POA: Insufficient documentation

## 2018-12-21 DIAGNOSIS — R7989 Other specified abnormal findings of blood chemistry: Secondary | ICD-10-CM | POA: Diagnosis not present

## 2018-12-30 ENCOUNTER — Telehealth: Payer: Self-pay | Admitting: Cardiovascular Disease

## 2018-12-30 DIAGNOSIS — R002 Palpitations: Secondary | ICD-10-CM

## 2018-12-30 NOTE — Telephone Encounter (Signed)
Pt will stop by tomorrow and I will place event monitor, he has f/u apt already with B.Strader PA-C

## 2018-12-30 NOTE — Telephone Encounter (Signed)
Over past month, patient has had sensation in his chest that his heart is flopping around.He states episodes can last for hours, come every day for a week and then stop for several days.His HR is consistent at 80-90 bpm and is regular. He bought a BP machine that tells you if your HR is irregular and he has taken BP 3 x a day for a week and it has only reported one episode of irregular heart beat.   Denies alcohol , caffeine (other than 1 glass tea a week ) says stress is the usual,denies CP    Has apt scheduled with B.Strader PA-C on 01/18/19

## 2018-12-30 NOTE — Telephone Encounter (Signed)
Can order event monitor 30 day and f/u with [facility]

## 2018-12-30 NOTE — Telephone Encounter (Signed)
Patient called stating that he's been having his heart racing and that it's "flopping" around in his chest, states it doesn't happen everyday, that it can happen for 3 days straight and then stop for 2 days and come back.   He's started keeping up with his blood pressure last week and it's been running high 150-155/90-95  Scheduled him to see B. Strader on  01/18/2019

## 2018-12-31 ENCOUNTER — Ambulatory Visit (INDEPENDENT_AMBULATORY_CARE_PROVIDER_SITE_OTHER): Payer: Commercial Managed Care - PPO

## 2018-12-31 DIAGNOSIS — R002 Palpitations: Secondary | ICD-10-CM

## 2019-01-18 ENCOUNTER — Encounter: Payer: Self-pay | Admitting: Student

## 2019-01-18 ENCOUNTER — Ambulatory Visit (INDEPENDENT_AMBULATORY_CARE_PROVIDER_SITE_OTHER): Payer: Commercial Managed Care - PPO | Admitting: Student

## 2019-01-18 VITALS — BP 156/80 | HR 99 | Ht 71.0 in | Wt 252.0 lb

## 2019-01-18 DIAGNOSIS — IMO0001 Reserved for inherently not codable concepts without codable children: Secondary | ICD-10-CM

## 2019-01-18 DIAGNOSIS — R002 Palpitations: Secondary | ICD-10-CM

## 2019-01-18 DIAGNOSIS — I10 Essential (primary) hypertension: Secondary | ICD-10-CM

## 2019-01-18 DIAGNOSIS — R9431 Abnormal electrocardiogram [ECG] [EKG]: Secondary | ICD-10-CM | POA: Diagnosis not present

## 2019-01-18 DIAGNOSIS — E119 Type 2 diabetes mellitus without complications: Secondary | ICD-10-CM

## 2019-01-18 DIAGNOSIS — E785 Hyperlipidemia, unspecified: Secondary | ICD-10-CM | POA: Diagnosis not present

## 2019-01-18 DIAGNOSIS — Z79899 Other long term (current) drug therapy: Secondary | ICD-10-CM

## 2019-01-18 DIAGNOSIS — Z794 Long term (current) use of insulin: Secondary | ICD-10-CM

## 2019-01-18 DIAGNOSIS — R7989 Other specified abnormal findings of blood chemistry: Secondary | ICD-10-CM | POA: Diagnosis not present

## 2019-01-18 MED ORDER — AMLODIPINE BESYLATE-VALSARTAN 10-320 MG PO TABS: 1.0000 | ORAL_TABLET | Freq: Every day | ORAL | 11 refills | Status: DC

## 2019-01-18 NOTE — Progress Notes (Signed)
Cardiology Office Note    Date:  01/18/2019   ID:  DARYL BEEHLER, DOB January 10, 1961, MRN [id number]  PCP:  Redmond School, MD  Cardiologist: Jenkins Rouge, MD    Chief Complaint  Patient presents with  . Follow-up    Palpitations    History of Present Illness:    [name] is a 58 y.o. male with past medical history of HTN, HLD, and Type 2 DM who presents to the office today for evaluation of palpitations.  He was last examined by Dr. Johnsie Cancel in 04/2018 as a new patient referral for palpitations. Denied any recent chest discomfort but did report a "funny feeling" in his chest when the palpitations would occur. An event monitor was recommended by review of the notes but not obtained. He did undergo stress testing which showed no significant EKG changes and perfusion was normal with no evidence of ischemia. Was overall a low risk study.  He called the office on 12/30/2018 reporting a "flopping sensation" along his chest which was occurring intermittently.  Was also having issues with elevated BP. Dr. Johnsie Cancel recommended he have a 30-day cardiac event monitor placed and close follow-up.   In talking with the patient today, he reports his palpitations were occurring on a daily basis upon his monitor being placed but his episodes stopped last week. He denies any associated lightheadedness or dizziness when experiencing palpitations but describes the discomfort as a "adrenaline rush" throughout his chest. Symptoms could last for a few seconds or for several hours. He does not consume a significant amount of caffeine and reports consuming 1 beer every 2 to 3 weeks.  He denies any recent chest pain. Does have occasional dyspnea on exertion which is unchanged per his report and typically occurs when walking up hills while hunting. Denies any symptoms when climbing stairs at home or performing routine activities. No recent orthopnea, PND, or lower extremity edema.  BP has been elevated when checked  at home, at 156/80 during today's visit. He reports compliance with his medication regimen and denies missing any recent doses.   Past Medical History:  Diagnosis Date  . Diabetes mellitus without complication ([place])   . foot bone spur   . GERD (gastroesophageal reflux disease)   . Hyperlipidemia   . Hypertension   . Small bowel obstruction [facility])     Past Surgical History:  Procedure Laterality Date  . COLONOSCOPY  2007   [facility]- sigmoid colon polyp and TI lymphoid hypoplasia but o/w no evidence of crohn's disease, no bx report available.   . COLONOSCOPY N/A 08/31/2017   Procedure: COLONOSCOPY;  Surgeon: Danie Binder, MD;  Location: [facility];  Service: Endoscopy;  Laterality: N/A;  1030   . FOOT SURGERY    . GIVENS CAPSULE STUDY  10/12/2006   there was no evidence of active GI bleeding, inflammation or erosions on this study  . HERNIA REPAIR     as a child  . Pancreatic cancer surgery    . TONSILLECTOMY      Current Medications: Outpatient Medications Prior to Visit  Medication Sig Dispense Refill  . aspirin EC 81 MG tablet Take 81 mg by mouth daily.    . furosemide (LASIX) 20 MG tablet Take 1 tablet by mouth daily.  4  . hydrochlorothiazide (HYDRODIURIL) 25 MG tablet Take 25 mg by mouth daily.    . insulin glargine (LANTUS) 100 UNIT/ML injection Inject 61 Units into the skin every morning.     Marland Kitchen  lansoprazole (PREVACID) 15 MG capsule Take 15 mg by mouth daily.     . metFORMIN (GLUCOPHAGE) 500 MG tablet Take 500-1,000 mg by mouth See admin instructions. (2500 mg daily) 1000 mg  am  500 mg lunch 1000 mg pm    . Multiple Vitamins-Minerals (MULTIVITAMIN WITH MINERALS) tablet Take 1 tablet by mouth daily.    . simvastatin (ZOCOR) 40 MG tablet     . amLODipine-valsartan (EXFORGE) 10-160 MG tablet Take 1 tablet by mouth daily.    . simvastatin (ZOCOR) 20 MG tablet Take 20 mg by mouth daily.     No facility-administered medications prior to visit.      Allergies:    Cortisone and Penicillins   Social History   Socioeconomic History  . Marital status: Married    Spouse name: Not on file  . Number of children: Not on file  . Years of education: Not on file  . Highest education level: Not on file  Occupational History  . Not on file  Social Needs  . Financial resource strain: Not on file  . Food insecurity:    Worry: Not on file    Inability: Not on file  . Transportation needs:    Medical: Not on file    Non-medical: Not on file  Tobacco Use  . Smoking status: Never Smoker  . Smokeless tobacco: Current User    Types: Snuff  Substance and Sexual Activity  . Alcohol use: Not Currently    Alcohol/week: 1.0 standard drinks    Types: 1 Cans of beer per week  . Drug use: No  . Sexual activity: Yes  Lifestyle  . Physical activity:    Days per week: Not on file    Minutes per session: Not on file  . Stress: Not on file  Relationships  . Social connections:    Talks on phone: Not on file    Gets together: Not on file    Attends religious service: Not on file    Active member of club or organization: Not on file    Attends meetings of clubs or organizations: Not on file    Relationship status: Not on file  Other Topics Concern  . Not on file  Social History Narrative  . Not on file     Family History:  The patient's family history includes CAD in his mother; Diabetes in his mother; Hypertension in his mother; Stroke in his mother.   Review of Systems:   Please see the history of present illness.     General:  No chills, fever, night sweats or weight changes.  Cardiovascular:  No chest pain, dyspnea on exertion, edema, orthopnea, paroxysmal nocturnal dyspnea. Positive for palpitations.  Dermatological: No rash, lesions/masses Respiratory: No cough, dyspnea Urologic: No hematuria, dysuria Abdominal:   No nausea, vomiting, diarrhea, bright red blood per rectum, melena, or hematemesis Neurologic:  No visual changes, wkns, changes in  mental status. All other systems reviewed and are otherwise negative except as noted above.   Physical Exam:    VS:  BP (!) 156/80   Pulse 99   Ht 5\' 11"  (1.803 m)   Wt 252 lb (114.3 kg)   SpO2 96%   BMI 35.15 kg/m    General: Well developed, well nourished Caucasian male appearing in no acute distress. Head: Normocephalic, atraumatic, sclera non-icteric, no xanthomas, nares are without discharge.  Neck: No carotid bruits. JVD not elevated.  Lungs: Respirations regular and unlabored, without wheezes or rales.  Heart:  Regular rate and rhythm. No S3 or S4.  No murmur, no rubs, or gallops appreciated. Abdomen: Soft, non-tender, non-distended with normoactive bowel sounds. No hepatomegaly. No rebound/guarding. No obvious abdominal masses. Msk:  Strength and tone appear normal for age. No joint deformities or effusions. Extremities: No clubbing or cyanosis. No lower extremity edema.  Distal pedal pulses are 2+ bilaterally. Neuro: Alert and oriented X 3. Moves all extremities spontaneously. No focal deficits noted. Psych:  Responds to questions appropriately with a normal affect. Skin: No rashes or lesions noted  Wt Readings from Last 3 Encounters:  01/18/19 252 lb (114.3 kg)  04/29/18 250 lb 6.4 oz (113.6 kg)  08/31/17 240 lb (108.9 kg)     Studies/Labs Reviewed:   EKG:  EKG is ordered today.  The ekg ordered today demonstrates NSR, HR 93, with LVH and diffuse TWI along inferior and lateral leads which is similar to prior tracings from recent stress test but more pronounced since resting EKG in 03/2018.  Recent Labs: No results found for requested labs within last 8760 hours.   Lipid Panel No results found for: CHOL, TRIG, HDL, CHOLHDL, VLDL, LDLCALC, LDLDIRECT  Additional studies/ records that were reviewed today include:   NST: 04/2018  Blood pressure demonstrated a hypertensive response to exercise.  There was no ST segment deviation noted during stress. Nonspecific T  wave abnormalities seen inferolaterally.  The study is normal. No ischemic zones.  This is a low risk study.  Nuclear stress EF: 70%.   Event Monitor: Pending  Assessment:    1. Palpitations   2. Abnormal EKG   3. Essential hypertension   4. Hyperlipidemia LDL goal <70   5. IDDM (insulin dependent diabetes mellitus) ([place])   6. Medication management      Plan:   In order of problems listed above:  1. Palpitations - he reports episodes of an "adrenaline rush" throughout his chest occurring 3 weeks ago and happening on a daily basis but symptoms spontaneously resolved last week. Had recent labs by his PCP and will request these records. An event monitor has already been placed and he will continue wearing this for 2 more weeks. The office has not been made aware of any significant arrhythmias thus far. Will follow-up on results once available. If having frequent tachycardia or ectopic beats, would consider initiation of BB therapy. Would favor Coreg over Lopressor given associated elevated BP readings.   2. Abnormal EKG - EKG today demonstrates NSR, HR 93, with LVH and diffuse TWI along inferior and lateral leads which is similar to prior tracings from recent stress test and likely most representative of repol abnormalities. He denies any recent chest pain or changes in his respiratory status.  - recent NST in 04/2018 showed no significant ischemia. Given no recent symptoms and normal NST in 04/2018, would not pursue repeat ischemic testing at this time. Consider Coronary CT in the future if he develops recurrent symptoms.   3. HTN - BP is elevated at 156/80 during today's visit.  - currently on Amlodipine-Valsartan 10-160mg  daily and HCTZ 25mg  every other day (alternates taking this on the days he does not take Lasix). Will plan to titrate Amlodipine-Valsartan to 10-320mg  daily given his elevated readings. Repeat BMET in 2-3 weeks.   4. HLD - followed by PCP. Remains on Simvastatin  40mg  daily. If remaining on Amlodipine long-term, would reduce Simvastatin to 20mg  daily or switch to alternative statin given interaction.   5. IDDM - Hgb A1c at 7.1 in  2019. Followed by PCP.    Medication Adjustments/Labs and Tests Ordered: Current medicines are reviewed at length with the patient today.  Concerns regarding medicines are outlined above.  Medication changes, Labs and Tests ordered today are listed in the Patient Instructions below. Patient Instructions  Medication Instructions:  Your physician has recommended you make the following change in your medication:  Increase Amlodipine-Valsartan to 10-320   If you need a refill on your cardiac medications before your next appointment, please call your pharmacy.   Lab work: Your physician recommends that you return for lab work in: 1 Month ( 02/18/19)  If you have labs (blood work) drawn today and your tests are completely normal, you will receive your results only by: Marland Kitchen MyChart Message (if you have MyChart) OR . A paper copy in the mail If you have any lab test that is abnormal or we need to change your treatment, we will call you to review the results.  Testing/Procedures: NONE   Follow-Up: At [facility], you and your health needs are our priority.  As part of our continuing mission to provide you with exceptional heart care, we have created designated Provider Care Teams.  These Care Teams include your primary Cardiologist (physician) and Advanced Practice Providers (APPs -  Physician Assistants and Nurse Practitioners) who all work together to provide you with the care you need, when you need it. You will need a follow up appointment pending monitor results.   Please call our office 2 months in advance to schedule this appointment.  You may see Jenkins Rouge, MD or one of the following Advanced Practice Providers on your designated Care Team:   Bernerd Pho, PA-C [facility]) . Ermalinda Barrios, PA-C ([place])  Any Other Special Instructions Will Be Listed Below (If Applicable). Thank you for choosing [facility]!     Signed, Erma Heritage, PA-C  01/18/2019 8:06 PM    [place] [address] [place], [place] [address] Phone: [phone] Fax: [phone]

## 2019-01-18 NOTE — Patient Instructions (Signed)
Medication Instructions:  Your physician has recommended you make the following change in your medication:  Increase Amlodipine-Valsartan to 10-320   If you need a refill on your cardiac medications before your next appointment, please call your pharmacy.   Lab work: Your physician recommends that you return for lab work in: 1 Month ( 02/18/19)  If you have labs (blood work) drawn today and your tests are completely normal, you will receive your results only by: Marland Kitchen MyChart Message (if you have MyChart) OR . A paper copy in the mail If you have any lab test that is abnormal or we need to change your treatment, we will call you to review the results.  Testing/Procedures: NONE   Follow-Up: At [facility], you and your health needs are our priority.  As part of our continuing mission to provide you with exceptional heart care, we have created designated Provider Care Teams.  These Care Teams include your primary Cardiologist (physician) and Advanced Practice Providers (APPs -  Physician Assistants and Nurse Practitioners) who all work together to provide you with the care you need, when you need it. You will need a follow up appointment pending monitor results.   Please call our office 2 months in advance to schedule this appointment.  You may see Jenkins Rouge, MD or one of the following Advanced Practice Providers on your designated Care Team:   Bernerd Pho, PA-C [facility]) . Ermalinda Barrios, PA-C ([place])  Any Other Special Instructions Will Be Listed Below (If Applicable). Thank you for choosing [facility]!

## 2019-01-20 ENCOUNTER — Other Ambulatory Visit: Payer: Self-pay

## 2019-01-20 ENCOUNTER — Encounter: Payer: Self-pay | Admitting: General Surgery

## 2019-01-20 ENCOUNTER — Ambulatory Visit (INDEPENDENT_AMBULATORY_CARE_PROVIDER_SITE_OTHER): Payer: Commercial Managed Care - PPO | Admitting: General Surgery

## 2019-01-20 VITALS — BP 148/76 | HR 92 | Temp 96.8°F | Resp 18 | Wt 253.0 lb

## 2019-01-20 DIAGNOSIS — L723 Sebaceous cyst: Secondary | ICD-10-CM | POA: Insufficient documentation

## 2019-01-20 NOTE — Progress Notes (Signed)
[name]  Reason for Referral: Cyst  Referring Physician:  Dr. Antony Madura is a 58 y.o. male.  HPI: [name] is a 58 yo with DM, HTN, GERD, and pancreatic cancer of his tail s/p excision at [facility] that presents with a left posterior neck cyst that has been there for years. He says the cyst has been there for a long time and that it does not cause him any real issues and he does not think it has gotten any larger. He says that he gets a lot of people asking about the area and telling him that he needs to have it removed. He denies any drainage, swelling, or pain associated with the area. He recently has been having palpitations and is being worked up with a monitor, but he reports no episodes since wearing the monitor.   Past Medical History:  Diagnosis Date  . Diabetes mellitus without complication ([place])   . foot bone spur   . GERD (gastroesophageal reflux disease)   . Hyperlipidemia   . Hypertension   . Small bowel obstruction [facility])     Past Surgical History:  Procedure Laterality Date  . COLONOSCOPY  2007   [facility]- sigmoid colon polyp and TI lymphoid hypoplasia but o/w no evidence of crohn's disease, no bx report available.   . COLONOSCOPY N/A 08/31/2017   Procedure: COLONOSCOPY;  Surgeon: Danie Binder, MD;  Location: [facility];  Service: Endoscopy;  Laterality: N/A;  1030   . FOOT SURGERY    . GIVENS CAPSULE STUDY  10/12/2006   there was no evidence of active GI bleeding, inflammation or erosions on this study  . HERNIA REPAIR     as a child  . Pancreatic cancer surgery    . TONSILLECTOMY      Family History  Problem Relation Age of Onset  . Stroke Mother   . Hypertension Mother   . Diabetes Mother   . CAD Mother   . Colon cancer Neg Hx   . Colon polyps Neg Hx     Social History   Tobacco Use  . Smoking status: Never Smoker  . Smokeless tobacco: Current User    Types: Snuff  Substance Use  Topics  . Alcohol use: Not Currently    Alcohol/week: 1.0 standard drinks    Types: 1 Cans of beer per week  . Drug use: No    Medications: I have reviewed the patient's current medications. Allergies as of 01/20/2019      Reactions   Cortisone Other (See Comments)   Hypes pt up - unable to sleep for days at a time   Penicillins Rash   Has patient had a PCN reaction causing immediate rash, facial/tongue/throat swelling, SOB or lightheadedness with hypotension: Yes Has patient had a PCN reaction causing severe rash involving mucus membranes or skin necrosis: No Has patient had a PCN reaction that required hospitalization No Has patient had a PCN reaction occurring within the last 10 years: No If all of the above answers are "NO", then may proceed with Cephalosporin use.      Medication List       Accurate as of January 20, 2019  4:26 PM. Always use your most recent med list.        amLODipine-valsartan 10-320 MG tablet Commonly known as:  Exforge Take 1 tablet by mouth daily.   aspirin EC 81 MG tablet Take 81 mg by  mouth daily.   furosemide 20 MG tablet Commonly known as:  LASIX Take 1 tablet by mouth daily.   hydrochlorothiazide 25 MG tablet Commonly known as:  HYDRODIURIL Take 25 mg by mouth daily.   insulin glargine 100 UNIT/ML injection Commonly known as:  LANTUS Inject 61 Units into the skin every morning.   lansoprazole 15 MG capsule Commonly known as:  PREVACID Take 15 mg by mouth daily.   metFORMIN 500 MG tablet Commonly known as:  GLUCOPHAGE Take 500-1,000 mg by mouth See admin instructions. (2500 mg daily) 1000 mg  am  500 mg lunch 1000 mg pm   multivitamin with minerals tablet Take 1 tablet by mouth daily.   simvastatin 40 MG tablet Commonly known as:  ZOCOR        ROS:  A comprehensive review of systems was negative except for: Cardiovascular: positive for HTN cyst on left posterior neck  Blood pressure (!) 148/76, pulse 92, temperature  (!) 96.8 F (36 C), temperature source Temporal, resp. rate 18, weight 253 lb (114.8 kg). Mitchell Exam Vitals signs reviewed.  Constitutional:      Appearance: Normal appearance.  HENT:     Head: Normocephalic and atraumatic.     Nose: Nose normal.     Mouth/Throat:     Mouth: Mucous membranes are moist.  Eyes:     Extraocular Movements: Extraocular movements intact.     Pupils: Pupils are equal, round, and reactive to light.  Neck:     Musculoskeletal: Normal range of motion and neck supple.     Comments: Left posterior neck, mobile 1 cm area, superficial, consistent with cyst versus lipoma  Cardiovascular:     Rate and Rhythm: Normal rate and regular rhythm.  Pulmonary:     Effort: Pulmonary effort is normal.     Breath sounds: Normal breath sounds.  Abdominal:     General: There is no distension.     Palpations: Abdomen is soft.     Tenderness: There is no abdominal tenderness.  Musculoskeletal: Normal range of motion.        General: No swelling.  Skin:    General: Skin is warm and dry.  Neurological:     General: No focal deficit present.     Mental Status: He is alert and oriented to person, place, and time.  Psychiatric:        Mood and Affect: Mood normal.        Behavior: Behavior normal.        Thought Content: Thought content normal.        Judgment: Judgment normal.     Results: None   Assessment & Plan:  [name] is a 58 y.o. male with what is likely a left posterior neck cyst versus lipoma. He has a history of pancreatic cancer in his tail but this area has never grown and was there prior to his diagnosis.  The patient wants to get it removed because of the annoyance of it and so that it does not get infected and cause him problems in the future.  - Excision of left neck cyst with local anesthetic   All questions were answered to the satisfaction of the patient.  The risk and benefits of excision of the cyst were discussed including but not  limited to bleeding, infection, recurrence, injury to the nerve, numbness, and chance that this could be something more concerning like a lymph node.  After careful consideration, [name] has decided to  proceed.    [name] 01/20/2019, 4:26 PM

## 2019-01-20 NOTE — Patient Instructions (Signed)
Epidermal (Sebaceous) Cyst  An epidermal cyst is a sac made of skin tissue. The sac contains a substance called keratin. Keratin is a protein that is normally secreted through the hair follicles. When keratin becomes trapped in the top layer of skin (epidermis), it can form an epidermal cyst. Epidermal cysts can be found anywhere on your body. These cysts are usually harmless (benign), and they may not cause symptoms unless they become infected. What are the causes? This condition may be caused by:  A blocked hair follicle.  A hair that curls and re-enters the skin instead of growing straight out of the skin (ingrown hair).  A blocked pore.  Irritated skin.  An injury to the skin.  Certain conditions that are passed along from parent to child (inherited).  Human papillomavirus (HPV).  Long-term (chronic) sun damage to the skin. What increases the risk? The following factors may make you more likely to develop an epidermal cyst:  Having acne.  Being overweight.  Being 57-95 years old. What are the signs or symptoms? The only symptom of this condition may be a small, painless lump underneath the skin. When an epidermal cyst ruptures, it may become infected. Symptoms may include:  Redness.  Inflammation.  Tenderness.  Warmth.  Fever.  Keratin draining from the cyst. Keratin is grayish-white, bad-smelling substance.  Pus draining from the cyst. How is this diagnosed? This condition is diagnosed with a physical exam.  In some cases, you may have a sample of tissue (biopsy) taken from your cyst to be examined under a microscope or tested for bacteria.  You may be referred to a health care provider who specializes in skin care (dermatologist). How is this treated? In many cases, epidermal cysts go away on their own without treatment. If a cyst becomes infected, treatment may include:  Opening and draining the cyst, done by a health care provider. After draining, minor  surgery to remove the rest of the cyst may be done.  Antibiotic medicine.  Injections of medicines (steroids) that help to reduce inflammation.  Surgery to remove the cyst. Surgery may be done if the cyst: ? Becomes large. ? Bothers you. ? Has a chance of turning into cancer.  Do not try to open a cyst yourself. Follow these instructions at home:  Take over-the-counter and prescription medicines only as told by your health care provider.  If you were prescribed an antibiotic medicine, take it it as told by your health care provider. Do not stop using the antibiotic even if you start to feel better.  Keep the area around your cyst clean and dry.  Wear loose, dry clothing.  Avoid touching your cyst.  Check your cyst every day for signs of infection. Check for: ? Redness, swelling, or pain. ? Fluid or blood. ? Warmth. ? Pus or a bad smell.  Keep all follow-up visits as told by your health care provider. This is important. How is this prevented?  Wear clean, dry, clothing.  Avoid wearing tight clothing.  Keep your skin clean and dry. Take showers or baths every day. Contact a health care provider if:  Your cyst develops symptoms of infection.  Your condition is not improving or is getting worse.  You develop a cyst that looks different from other cysts you have had.  You have a fever. Get help right away if:  Redness spreads from the cyst into the surrounding area. Summary  An epidermal cyst is a sac made of skin tissue. These cysts  are usually harmless (benign), and they may not cause symptoms unless they become infected.  If a cyst becomes infected, treatment may include surgery to open and drain the cyst, or to remove it. Treatment may also include medicines by mouth or through an injection.  Take over-the-counter and prescription medicines only as told by your health care provider. If you were prescribed an antibiotic medicine, take it as told by your health  care provider. Do not stop using the antibiotic even if you start to feel better.  Contact a health care provider if your condition is not improving or is getting worse.  Keep all follow-up visits as told by your health care provider. This is important. This information is not intended to replace advice given to you by your health care provider. Make sure you discuss any questions you have with your health care provider. Document Released: 09/27/2004 Document Revised: 05/10/2018 Document Reviewed: 05/10/2018 Elsevier Interactive Patient Education  2019 [employer].   Epidermal Cyst Removal  Epidermal cyst removal is a procedure to remove a sac of oily material (keratin) that has formed under your skin (epidermal cyst). Epidermal cysts may also be called epidermoid cysts, sebaceous cysts, or keratin cysts. Normally, the skin secretes this oily material through a gland or a hair follicle. However, when a skin gland or hair follicle becomes blocked, an epidermal cyst can form. You may need this procedure if you have an epidermal cyst that becomes large, uncomfortable, or infected. Tell a health care provider about:  Any allergies you have.  All medicines you are taking, including vitamins, herbs, eye drops, creams, and over-the-counter medicines.  Any problems you or family members have had with anesthetic medicines.  Any blood disorders you have.  Any surgeries you have had.  Any medical conditions you have now or have had.  Whether you are pregnant or may be pregnant. What are the risks? Generally, this is a safe procedure. However, problems may occur, including:  Development of another cyst.  Bleeding.  Infection.  Scarring. What happens before the procedure?  Ask your health care provider about: ? Changing or stopping your regular medicines. This is especially important if you are taking diabetes medicines or blood thinners. ? Taking medicines such as aspirin and  ibuprofen. These medicines can thin your blood. Do not take these medicines unless your health care provider tells you to take them. ? Taking over-the-counter medicines, vitamins, herbs, and supplements.  If you have an infected cyst, you may have to take antibiotic medicine before the cyst removal. Take your antibiotic as told by your health care provider. Do not stop taking the antibiotic even if you start to feel better.  Take a shower on the morning of your procedure. Your health care provider may ask you to use a germ-killing soap. What happens during the procedure?   You will be given a medicine to numb the area (local anesthetic).  The skin around the cyst will be cleaned with a germ-killing solution.  The health care provider will make a small incision in your skin over the cyst.  The health care provider will separate the cyst from the surrounding tissues that are under your skin.  If possible, the cyst will be removed undamaged (intact).  If the cyst bursts (ruptures), it will be removed in pieces.  After the cyst is removed, the health care provider will control any bleeding and close the incision with small stitches (sutures). Small incisions may not need sutures, and the bleeding  will be controlled by applying direct pressure with gauze.  The health care provider may apply antibiotic ointment and a bandage (dressing) over the incision. The procedure may vary among health care providers and hospitals. What happens after the procedure?  If your cyst ruptured during the procedure, you may need an antibiotic. If you are prescribed an antibiotic medicine or ointment, take or apply it as told by your health care provider. Do not stop using the antibiotic even if you start to feel better. Summary  Epidermal cyst removal is a procedure to remove a sac of oily material (keratin) that has formed under your skin (epidermal cyst).  You may need this procedure if you have an epidermal  cyst that becomes large, uncomfortable, or infected.  The health care provider will make a small incision in your skin to remove the cyst.  If you are prescribed an antibiotic medicine before the procedure, after the procedure, or both, use the antibiotic as told by your health care provider. Do not stop using the antibiotic even if you start to feel better. This information is not intended to replace advice given to you by your health care provider. Make sure you discuss any questions you have with your health care provider. Document Released: 10/24/2000 Document Revised: 02/16/2018 Document Reviewed: 08/20/2017 Elsevier Interactive Patient Education  2019 [employer].

## 2019-01-21 NOTE — H&P (Signed)
[facility] History and Physical  Reason for Referral: Cyst  Referring Physician:  Dr. [name] is a 58 y.o. male.  HPI: [name] is a 58 yo with DM, HTN, GERD, and pancreatic cancer of his tail s/p excision at [facility] that presents with a left posterior neck cyst that has been there for years. He says the cyst has been there for a long time and that it does not cause him any real issues and he does not think it has gotten any larger. He says that he gets a lot of people asking about the area and telling him that he needs to have it removed. He denies any drainage, swelling, or pain associated with the area. He recently has been having palpitations and is being worked up with a monitor, but he reports no episodes since wearing the monitor.       Past Medical History:  Diagnosis Date  . Diabetes mellitus without complication ([place])   . foot bone spur   . GERD (gastroesophageal reflux disease)   . Hyperlipidemia   . Hypertension   . Small bowel obstruction [facility])          Past Surgical History:  Procedure Laterality Date  . COLONOSCOPY  2007   [facility]- sigmoid colon polyp and TI lymphoid hypoplasia but o/w no evidence of crohn's disease, no bx report available.   . COLONOSCOPY N/A 08/31/2017   Procedure: COLONOSCOPY;  Surgeon: Danie Binder, MD;  Location[facility];  Service: Endoscopy;  Laterality: N/A;  1030   . FOOT SURGERY    . GIVENS CAPSULE STUDY  10/12/2006   there was no evidence of active GI bleeding, inflammation or erosions on this study  . HERNIA REPAIR     as a child  . Pancreatic cancer surgery    . TONSILLECTOMY           Family History  Problem Relation Age of Onset  . Stroke Mother   . Hypertension Mother   . Diabetes Mother   . CAD Mother   . Colon cancer Neg Hx   . Colon polyps Neg Hx     Social History        Tobacco Use  . Smoking status: Never Smoker  . Smokeless  tobacco: Current User    Types: Snuff  Substance Use Topics  . Alcohol use: Not Currently    Alcohol/week: 1.0 standard drinks    Types: 1 Cans of beer per week  . Drug use: No    Medications: I have reviewed the patient's current medications.      Allergies as of 01/20/2019      Reactions   Cortisone Other (See Comments)   Hypes pt up - unable to sleep for days at a time   Penicillins Rash   Has patient had a PCN reaction causing immediate rash, facial/tongue/throat swelling, SOB or lightheadedness with hypotension: Yes Has patient had a PCN reaction causing severe rash involving mucus membranes or skin necrosis: No Has patient had a PCN reaction that required hospitalization No Has patient had a PCN reaction occurring within the last 10 years: No If all of the above answers are "NO", then may proceed with Cephalosporin use.         Medication List       Accurate as of January 20, 2019  4:26 PM. Always use your most recent med list.  amLODipine-valsartan 10-320 MG tablet Commonly known as:  Exforge Take 1 tablet by mouth daily.   aspirin EC 81 MG tablet Take 81 mg by mouth daily.   furosemide 20 MG tablet Commonly known as:  LASIX Take 1 tablet by mouth daily.   hydrochlorothiazide 25 MG tablet Commonly known as:  HYDRODIURIL Take 25 mg by mouth daily.   insulin glargine 100 UNIT/ML injection Commonly known as:  LANTUS Inject 61 Units into the skin every morning.   lansoprazole 15 MG capsule Commonly known as:  PREVACID Take 15 mg by mouth daily.   metFORMIN 500 MG tablet Commonly known as:  GLUCOPHAGE Take 500-1,000 mg by mouth See admin instructions. (2500 mg daily) 1000 mg  am  500 mg lunch 1000 mg pm   multivitamin with minerals tablet Take 1 tablet by mouth daily.   simvastatin 40 MG tablet Commonly known as:  ZOCOR        ROS:  A comprehensive review of systems was negative except for: Cardiovascular:  positive for HTN cyst on left posterior neck  Blood pressure (!) 148/76, pulse 92, temperature (!) 96.8 F (36 C), temperature source Temporal, resp. rate 18, weight 253 lb (114.8 kg). Physical Exam Vitals signs reviewed.  Constitutional:      Appearance: Normal appearance.  HENT:     Head: Normocephalic and atraumatic.     Nose: Nose normal.     Mouth/Throat:     Mouth: Mucous membranes are moist.  Eyes:     Extraocular Movements: Extraocular movements intact.     Pupils: Pupils are equal, round, and reactive to light.  Neck:     Musculoskeletal: Normal range of motion and neck supple.     Comments: Left posterior neck, mobile 1 cm area, superficial, consistent with cyst versus lipoma  Cardiovascular:     Rate and Rhythm: Normal rate and regular rhythm.  Pulmonary:     Effort: Pulmonary effort is normal.     Breath sounds: Normal breath sounds.  Abdominal:     General: There is no distension.     Palpations: Abdomen is soft.     Tenderness: There is no abdominal tenderness.  Musculoskeletal: Normal range of motion.        General: No swelling.  Skin:    General: Skin is warm and dry.  Neurological:     General: No focal deficit present.     Mental Status: He is alert and oriented to person, place, and time.  Psychiatric:        Mood and Affect: Mood normal.        Behavior: Behavior normal.        Thought Content: Thought content normal.        Judgment: Judgment normal.     Results: None   Assessment & Plan:  [name] is a 58 y.o. male with what is likely a left posterior neck cyst versus lipoma. He has a history of pancreatic cancer in his tail but this area has never grown and was there prior to his diagnosis.  The patient wants to get it removed because of the annoyance of it and so that it does not get infected and cause him problems in the future.  - Excision of left neck cyst with local anesthetic   All questions were answered to the satisfaction  of the patient.  The risk and benefits of excision of the cyst were discussed including but not limited to bleeding, infection, recurrence, injury to  the nerve, numbness, and chance that this could be something more concerning like a lymph node.  After careful consideration, [name] has decided to proceed.    Virl Cagey 01/20/2019, 4:26 PM

## 2019-01-31 ENCOUNTER — Encounter: Payer: Self-pay | Admitting: *Deleted

## 2019-02-11 ENCOUNTER — Ambulatory Visit: Admit: 2019-02-11 | Payer: Commercial Managed Care - PPO | Admitting: General Surgery

## 2019-02-11 SURGERY — MINOR EXCISION OF MASS
Anesthesia: LOCAL

## 2019-03-06 ENCOUNTER — Encounter: Payer: Self-pay | Admitting: General Surgery

## 2019-03-28 DIAGNOSIS — Z1389 Encounter for screening for other disorder: Secondary | ICD-10-CM | POA: Diagnosis not present

## 2019-03-28 DIAGNOSIS — N400 Enlarged prostate without lower urinary tract symptoms: Secondary | ICD-10-CM | POA: Diagnosis not present

## 2019-03-28 DIAGNOSIS — R7989 Other specified abnormal findings of blood chemistry: Secondary | ICD-10-CM | POA: Diagnosis not present

## 2019-03-28 DIAGNOSIS — Z6835 Body mass index (BMI) 35.0-35.9, adult: Secondary | ICD-10-CM | POA: Diagnosis not present

## 2019-03-28 DIAGNOSIS — E119 Type 2 diabetes mellitus without complications: Secondary | ICD-10-CM | POA: Diagnosis not present

## 2019-03-28 DIAGNOSIS — K219 Gastro-esophageal reflux disease without esophagitis: Secondary | ICD-10-CM | POA: Diagnosis not present

## 2019-03-28 DIAGNOSIS — M75101 Unspecified rotator cuff tear or rupture of right shoulder, not specified as traumatic: Secondary | ICD-10-CM | POA: Diagnosis not present

## 2019-05-20 IMAGING — CT CT ABDOMEN WO/W CM
1 of 5 series · 9 of 32 positions shown, 15 images · IV contrast (APPLIED)
Comparison: 12/27/2016

CLINICAL DATA: Neuroendocrine carcinoma of the pancreas, prior
resection in April 2016, restaging assessment.

EXAM:
CT ABDOMEN WITHOUT AND WITH CONTRAST
TECHNIQUE: Multidetector CT imaging of the abdomen was performed following the
standard protocol before and following the bolus administration of
intravenous contrast.
CONTRAST:  125mL 4L5PDH-N99 IOPAMIDOL (4L5PDH-N99) INJECTION 61%
Creatinine was obtained on site at [HOSPITAL] at [HOSPITAL].
Results: Creatinine 0.7 mg/dL.

[Series 4: venous · axial · portal-venous · 0.94mm/px · z∈[-252,+0]mm · 9 of 106 slices shown, 15 images]
[im 11/106  soft-tissue]
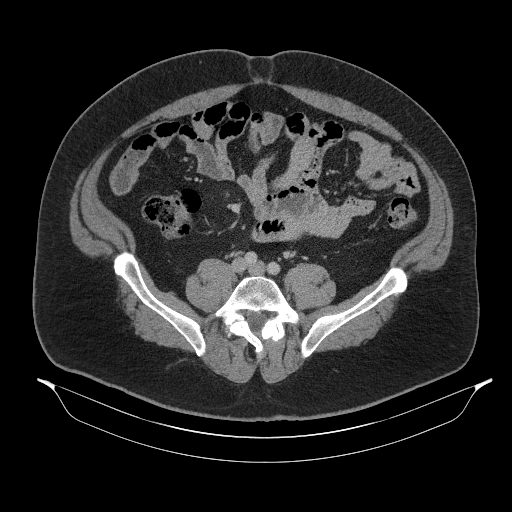
[im 11/106  bone]
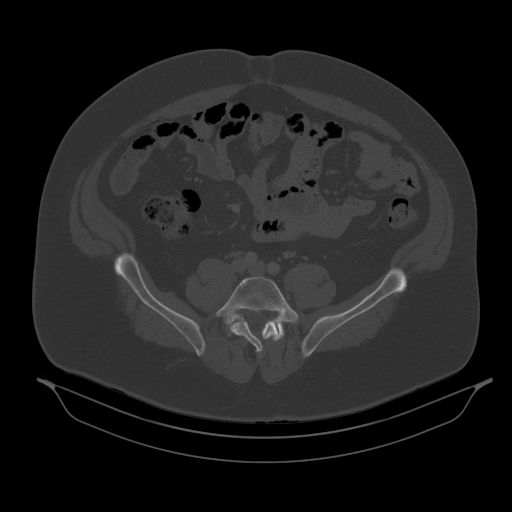
[im 22/106  soft-tissue]
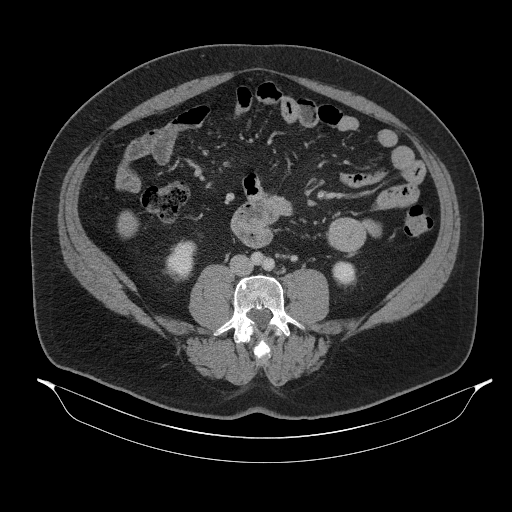
[im 32/106  soft-tissue]
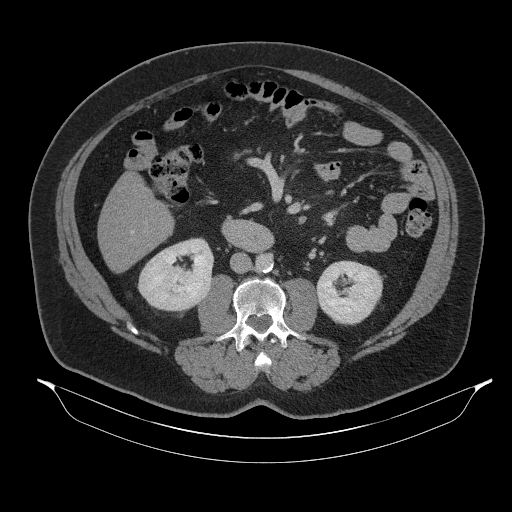
[im 43/106  soft-tissue]
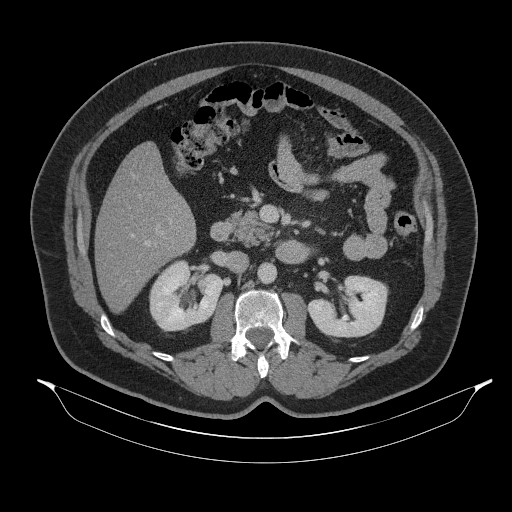
[im 53/106  soft-tissue]
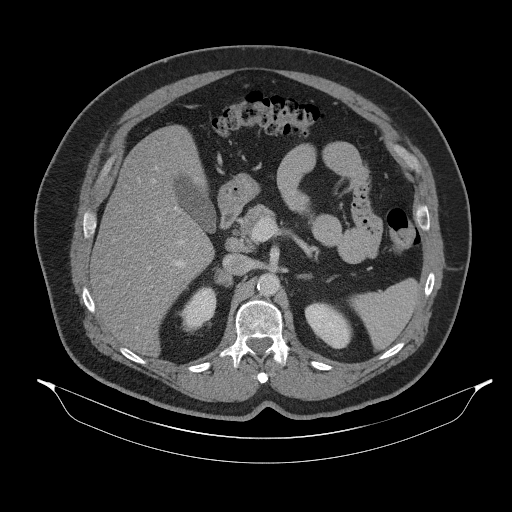
[im 64/106  soft-tissue]
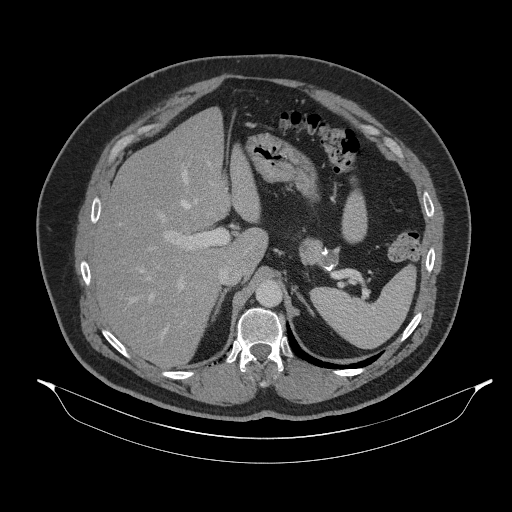
[im 64/106  lung]
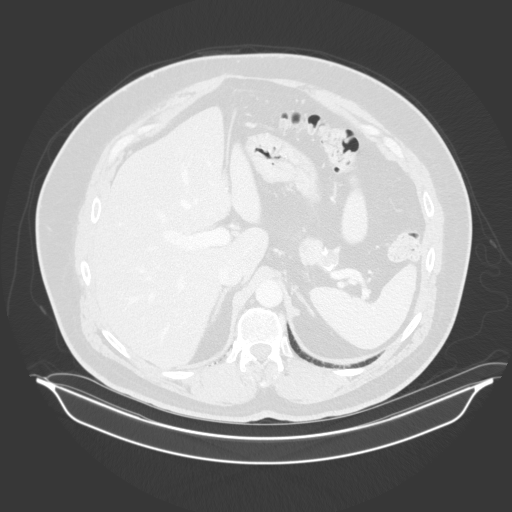
[im 74/106  soft-tissue]
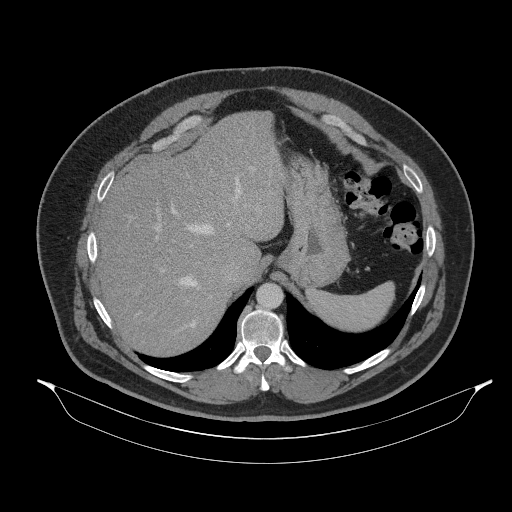
[im 74/106  lung]
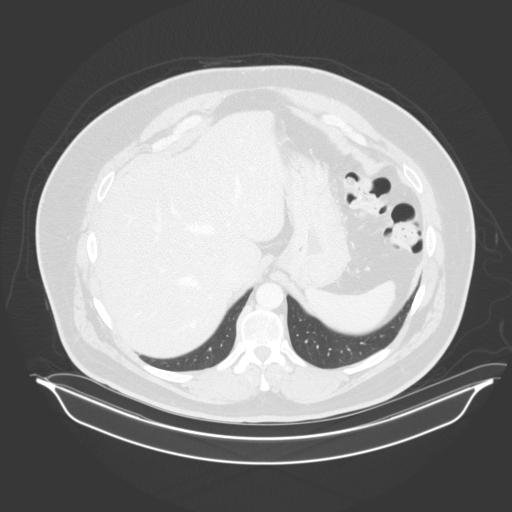
[im 85/106  soft-tissue]
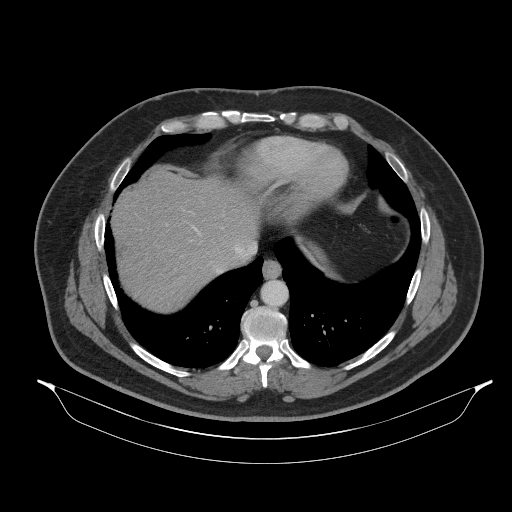
[im 85/106  lung]
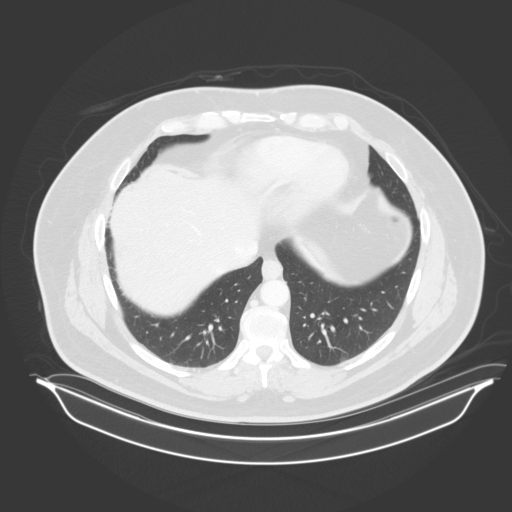
[im 95/106  soft-tissue]
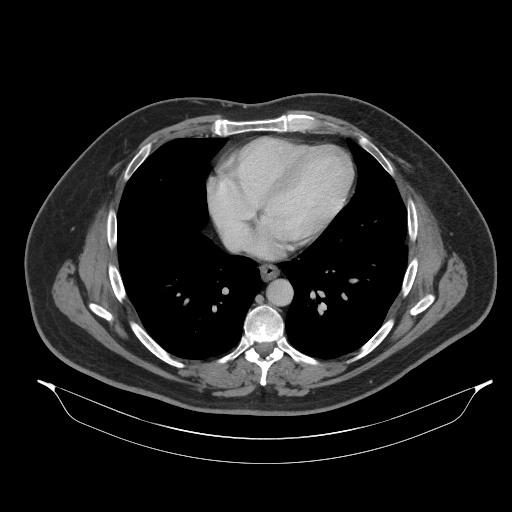
[im 95/106  lung]
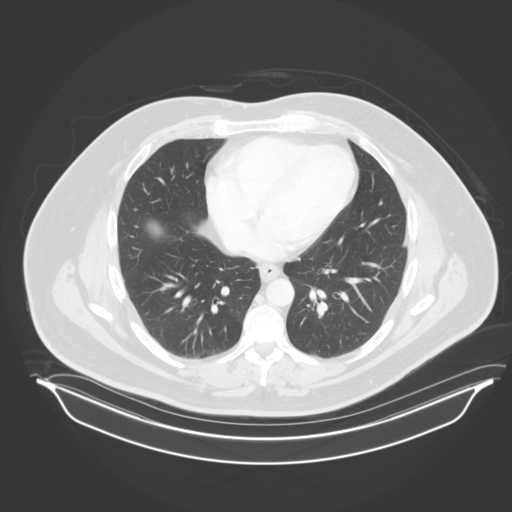
[im 95/106  bone]
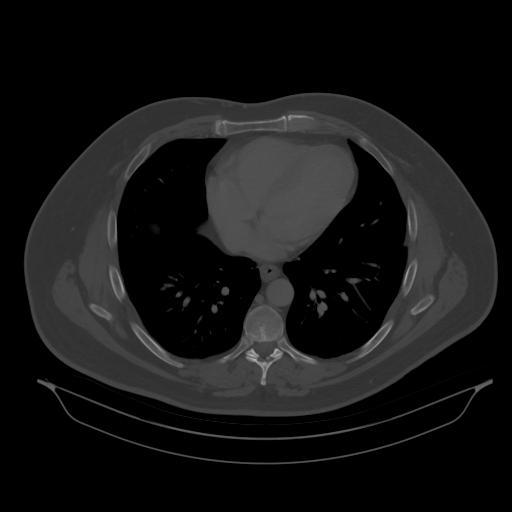

[9 of 32 positions shown; findings below may reference images not displayed]

FINDINGS: Lower chest: Mild distal esophageal wall thickening.

Hepatobiliary: Stable 4 mm focus of arterial phase enhancement in
the dome of the right hepatic lobe on image [DATE] with mild
surrounding transient hepatic attenuation difference common no
change from 02/25/2016, likely a small benign vascular malformation.

A small hypodense lesion posteriorly in the right hepatic lobe on
image 64/3 measures 0.8 cm in diameter and is compatible with a
cyst.

Pancreas: Calcification and/or suture material along the margins of
a 1.8 by 1.8 cm mildly complex cystic lesion at the resection site
along the pancreatic tail on image 47/3. No definite abnormal
enhancement.

Spleen: Unremarkable

Adrenals/Urinary Tract: 2.3 by 1.4 cm right adrenal adenoma.
Otherwise unremarkable.

Stomach/Bowel: Suture material along the margin of the stomach near
the pancreatic tail.

Vascular/Lymphatic: Aortoiliac atherosclerotic vascular disease.

Other: No supplemental non-categorized findings.

Musculoskeletal: Small umbilical hernia contains adipose tissues.
Chronic right pars defect at L5 without anterolisthesis. Fused facet
joints and partial interbody fusion at L4-5 with a small left neural
foramen likely causing some degree of impingement.
IMPRESSION: 1. Reduction in size of the small residual complex cystic lesion
along the pancreatic tail, currently 1.8 cm in diameter (previously
1.9 by 2.4 cm). No recurrent mass.
2. Hepatic steatosis.
3. Distal esophageal wall thickening could reflect esophagitis.
4. Tiny focus of arterial phase enhancement in the dome of the right
hepatic lobe likely a small benign vascular malformation and
unchanged from 3952.
5. Small right adrenal adenoma.
6.  Aortic Atherosclerosis ([id number]).
7. Chronic right pars defect at L5 with some inter facet and
interbody fusion at L4-5.

## 2019-08-15 ENCOUNTER — Other Ambulatory Visit: Payer: Self-pay | Admitting: General Surgery

## 2019-08-15 ENCOUNTER — Other Ambulatory Visit: Payer: Self-pay | Admitting: Nurse Practitioner

## 2019-08-15 DIAGNOSIS — C7A8 Other malignant neuroendocrine tumors: Secondary | ICD-10-CM

## 2019-08-16 ENCOUNTER — Other Ambulatory Visit: Payer: Self-pay | Admitting: Nurse Practitioner

## 2019-08-17 ENCOUNTER — Other Ambulatory Visit: Payer: Self-pay | Admitting: Nurse Practitioner

## 2019-08-17 DIAGNOSIS — C7A8 Other malignant neuroendocrine tumors: Secondary | ICD-10-CM

## 2019-08-19 ENCOUNTER — Ambulatory Visit
Admission: RE | Admit: 2019-08-19 | Discharge: 2019-08-19 | Disposition: A | Discharge status: Home / Self Care | Payer: Commercial Managed Care - PPO | Source: Ambulatory Visit | Attending: General Surgery | Admitting: General Surgery

## 2019-08-19 ENCOUNTER — Ambulatory Visit
Admission: RE | Admit: 2019-08-19 | Discharge: 2019-08-19 | Disposition: A | Discharge status: Home / Self Care | Payer: Commercial Managed Care - PPO | Source: Ambulatory Visit | Attending: Nurse Practitioner | Admitting: Nurse Practitioner

## 2019-08-19 ENCOUNTER — Other Ambulatory Visit: Payer: Self-pay

## 2019-08-19 DIAGNOSIS — C7A8 Other malignant neuroendocrine tumors: Secondary | ICD-10-CM

## 2019-08-19 DIAGNOSIS — D3501 Benign neoplasm of right adrenal gland: Secondary | ICD-10-CM | POA: Diagnosis not present

## 2019-08-19 DIAGNOSIS — R911 Solitary pulmonary nodule: Secondary | ICD-10-CM | POA: Diagnosis not present

## 2019-08-19 MED ORDER — IOPAMIDOL (ISOVUE-300) INJECTION 61%
125.0000 mL | Freq: Once | INTRAVENOUS | Status: AC | PRN
  Administered 2019-08-19: 125 mL via INTRAVENOUS

## 2019-09-14 DIAGNOSIS — Z23 Encounter for immunization: Secondary | ICD-10-CM | POA: Diagnosis not present

## 2019-09-14 DIAGNOSIS — I10 Essential (primary) hypertension: Secondary | ICD-10-CM | POA: Diagnosis not present

## 2019-09-14 DIAGNOSIS — N400 Enlarged prostate without lower urinary tract symptoms: Secondary | ICD-10-CM | POA: Diagnosis not present

## 2019-09-14 DIAGNOSIS — Z6836 Body mass index (BMI) 36.0-36.9, adult: Secondary | ICD-10-CM | POA: Diagnosis not present

## 2019-09-14 DIAGNOSIS — E119 Type 2 diabetes mellitus without complications: Secondary | ICD-10-CM | POA: Diagnosis not present

## 2019-09-14 DIAGNOSIS — E291 Testicular hypofunction: Secondary | ICD-10-CM | POA: Diagnosis not present

## 2019-09-14 DIAGNOSIS — M545 Low back pain: Secondary | ICD-10-CM | POA: Diagnosis not present

## 2019-09-14 DIAGNOSIS — K219 Gastro-esophageal reflux disease without esophagitis: Secondary | ICD-10-CM | POA: Diagnosis not present

## 2019-10-14 DIAGNOSIS — E291 Testicular hypofunction: Secondary | ICD-10-CM | POA: Diagnosis not present

## 2019-10-14 DIAGNOSIS — E6609 Other obesity due to excess calories: Secondary | ICD-10-CM | POA: Diagnosis not present

## 2019-10-14 DIAGNOSIS — Z6836 Body mass index (BMI) 36.0-36.9, adult: Secondary | ICD-10-CM | POA: Diagnosis not present

## 2019-10-14 DIAGNOSIS — R7989 Other specified abnormal findings of blood chemistry: Secondary | ICD-10-CM | POA: Diagnosis not present

## 2019-10-14 DIAGNOSIS — J069 Acute upper respiratory infection, unspecified: Secondary | ICD-10-CM | POA: Diagnosis not present

## 2019-11-15 DIAGNOSIS — R7989 Other specified abnormal findings of blood chemistry: Secondary | ICD-10-CM | POA: Diagnosis not present

## 2019-11-28 DIAGNOSIS — Z6836 Body mass index (BMI) 36.0-36.9, adult: Secondary | ICD-10-CM | POA: Diagnosis not present

## 2019-11-28 DIAGNOSIS — J019 Acute sinusitis, unspecified: Secondary | ICD-10-CM | POA: Diagnosis not present

## 2019-11-28 DIAGNOSIS — E6609 Other obesity due to excess calories: Secondary | ICD-10-CM | POA: Diagnosis not present

## 2019-11-28 DIAGNOSIS — J301 Allergic rhinitis due to pollen: Secondary | ICD-10-CM | POA: Diagnosis not present

## 2019-12-04 ENCOUNTER — Emergency Department (HOSPITAL_COMMUNITY)
Admission: EM | Admit: 2019-12-04 | Discharge: 2019-12-04 | Disposition: A | Discharge status: Home / Self Care | Payer: Commercial Managed Care - PPO | Attending: Emergency Medicine | Admitting: Emergency Medicine

## 2019-12-04 ENCOUNTER — Encounter (HOSPITAL_COMMUNITY): Payer: Self-pay | Admitting: Emergency Medicine

## 2019-12-04 ENCOUNTER — Emergency Department (HOSPITAL_COMMUNITY): Payer: Commercial Managed Care - PPO

## 2019-12-04 ENCOUNTER — Other Ambulatory Visit: Payer: Self-pay

## 2019-12-04 ENCOUNTER — Ambulatory Visit: Payer: Self-pay

## 2019-12-04 DIAGNOSIS — Z7982 Long term (current) use of aspirin: Secondary | ICD-10-CM | POA: Diagnosis not present

## 2019-12-04 DIAGNOSIS — I10 Essential (primary) hypertension: Secondary | ICD-10-CM | POA: Diagnosis not present

## 2019-12-04 DIAGNOSIS — R069 Unspecified abnormalities of breathing: Secondary | ICD-10-CM | POA: Diagnosis not present

## 2019-12-04 DIAGNOSIS — Z794 Long term (current) use of insulin: Secondary | ICD-10-CM | POA: Insufficient documentation

## 2019-12-04 DIAGNOSIS — U071 COVID-19: Secondary | ICD-10-CM | POA: Insufficient documentation

## 2019-12-04 DIAGNOSIS — E119 Type 2 diabetes mellitus without complications: Secondary | ICD-10-CM | POA: Diagnosis not present

## 2019-12-04 DIAGNOSIS — Z79899 Other long term (current) drug therapy: Secondary | ICD-10-CM | POA: Diagnosis not present

## 2019-12-04 DIAGNOSIS — R0602 Shortness of breath: Secondary | ICD-10-CM | POA: Diagnosis not present

## 2019-12-04 HISTORY — DX: COVID-19: U07.1

## 2019-12-04 LAB — TROPONIN I (HIGH SENSITIVITY)
Troponin I (High Sensitivity): 8 ng/L (ref <18)
Troponin I (High Sensitivity): 10 ng/L (ref <18)

## 2019-12-04 LAB — CBC WITH DIFFERENTIAL/PLATELET
Abs Immature Granulocytes: 0.16 10*3/uL — ABNORMAL HIGH (ref 0.00–0.07)
Basophils Absolute: 0.1 10*3/uL (ref 0.0–0.1)
Basophils Relative: 0 %
Eosinophils Absolute: 0 10*3/uL (ref 0.0–0.5)
Eosinophils Relative: 0 %
HCT: 45.9 % (ref 39.0–52.0)
Hemoglobin: 14.9 g/dL (ref 13.0–17.0)
Immature Granulocytes: 1 %
Lymphocytes Relative: 12 %
Lymphs Abs: 1.5 10*3/uL (ref 0.7–4.0)
MCH: 27.7 pg (ref 26.0–34.0)
MCHC: 32.5 g/dL (ref 30.0–36.0)
MCV: 85.3 fL (ref 80.0–100.0)
Monocytes Absolute: 1.4 10*3/uL — ABNORMAL HIGH (ref 0.1–1.0)
Monocytes Relative: 11 %
Neutro Abs: 9.4 10*3/uL — ABNORMAL HIGH (ref 1.7–7.7)
Neutrophils Relative %: 76 %
Platelets: 334 10*3/uL (ref 150–400)
RBC: 5.38 MIL/uL (ref 4.22–5.81)
RDW: 12.9 % (ref 11.5–15.5)
WBC: 12.6 10*3/uL — ABNORMAL HIGH (ref 4.0–10.5)
nRBC: 0 % (ref 0.0–0.2)

## 2019-12-04 LAB — COMPREHENSIVE METABOLIC PANEL
ALT: 67 U/L — ABNORMAL HIGH (ref 0–44)
AST: 45 U/L — ABNORMAL HIGH (ref 15–41)
Albumin: 3.7 g/dL (ref 3.5–5.0)
Alkaline Phosphatase: 48 U/L (ref 38–126)
Anion gap: 16 — ABNORMAL HIGH (ref 5–15)
BUN: 34 mg/dL — ABNORMAL HIGH (ref 6–20)
CO2: 21 mmol/L — ABNORMAL LOW (ref 22–32)
Calcium: 9.7 mg/dL (ref 8.9–10.3)
Chloride: 99 mmol/L (ref 98–111)
Creatinine, Ser: 1.08 mg/dL (ref 0.61–1.24)
GFR calc Af Amer: >60 mL/min (ref >60)
GFR calc non Af Amer: >60 mL/min (ref >60)
Glucose, Bld: 168 mg/dL — ABNORMAL HIGH (ref 70–99)
Potassium: 3.3 mmol/L — ABNORMAL LOW (ref 3.5–5.1)
Sodium: 136 mmol/L (ref 135–145)
Total Bilirubin: 0.7 mg/dL (ref 0.3–1.2)
Total Protein: 7.7 g/dL (ref 6.5–8.1)

## 2019-12-04 LAB — D-DIMER, QUANTITATIVE: D-Dimer, Quant: 0.27 ug/mL-FEU (ref 0.00–0.50)

## 2019-12-04 LAB — CBG MONITORING, ED: Glucose-Capillary: 147 mg/dL — ABNORMAL HIGH (ref 70–99)

## 2019-12-04 LAB — TRIGLYCERIDES: Triglycerides: 210 mg/dL — ABNORMAL HIGH (ref <150)

## 2019-12-04 LAB — LACTIC ACID, PLASMA
Lactic Acid, Venous: 3.1 mmol/L (ref 0.5–1.9)
Lactic Acid, Venous: 3 mmol/L (ref 0.5–1.9)

## 2019-12-04 LAB — C-REACTIVE PROTEIN: CRP: 5.9 mg/dL — ABNORMAL HIGH (ref <1.0)

## 2019-12-04 LAB — FIBRINOGEN: Fibrinogen: 568 mg/dL — ABNORMAL HIGH (ref 210–475)

## 2019-12-04 LAB — FERRITIN: Ferritin: 209 ng/mL (ref 24–336)

## 2019-12-04 LAB — PROCALCITONIN: Procalcitonin: <0.1 ng/mL

## 2019-12-04 LAB — LACTATE DEHYDROGENASE: LDH: 163 U/L (ref 98–192)

## 2019-12-04 MED ORDER — BENZONATATE 100 MG PO CAPS: 100.0000 mg | ORAL_CAPSULE | Freq: Three times a day (TID) | ORAL | 0 refills | Status: DC

## 2019-12-04 MED ORDER — SODIUM CHLORIDE 0.9 % IV BOLUS
1000.0000 mL | Freq: Once | INTRAVENOUS | Status: AC
  Administered 2019-12-04: 1000 mL via INTRAVENOUS

## 2019-12-04 MED ORDER — ALBUTEROL SULFATE HFA 108 (90 BASE) MCG/ACT IN AERS: 2.0000 | INHALATION_SPRAY | RESPIRATORY_TRACT | 0 refills | Status: DC | PRN

## 2019-12-04 MED ORDER — ALBUTEROL SULFATE HFA 108 (90 BASE) MCG/ACT IN AERS
6.0000 | INHALATION_SPRAY | Freq: Once | RESPIRATORY_TRACT | Status: AC
  Administered 2019-12-04: 6 via RESPIRATORY_TRACT
  Filled 2019-12-04: qty 6.7

## 2019-12-04 MED ORDER — DEXAMETHASONE 6 MG PO TABS: 6.0000 mg | ORAL_TABLET | Freq: Every day | ORAL | 0 refills | Status: AC

## 2019-12-04 NOTE — ED Notes (Signed)
Date and time results received: 12/04/19 5:29 PM (use smartphrase ".now" to insert current time)  Test: Lactic Acid Critical Value: 3.0  Name of Provider Notified: Britni PA  Orders Received? Or Actions Taken?: Orders Received - See Orders for details

## 2019-12-04 NOTE — ED Notes (Signed)
Pt O2 stayed between 94-95% while ambulating. Pt states he felt a little dizzy.

## 2019-12-04 NOTE — Telephone Encounter (Signed)
Aching all over, SOB at rest, tightness in chest  O2 sat 94 sitting up and when . laying down 85%- 88%- cant take a deep breath and feels like someone is  Hugging his chest  tight SOB overnight- Alert- cough-dry cough. Advised wife to call 911 to get seen at [facility] ED. Unable to route to provider.  Reason for Disposition . [1] MODERATE difficulty breathing (e.g., speaks in phrases, SOB even at rest, pulse 100-120) AND [2] NEW-onset or WORSE than normal  Answer Assessment - Initial Assessment Questions 1. RESPIRATORY STATUS: "Describe your breathing?" (e.g., wheezing, shortness of breath, unable to speak, severe coughing)      SOB at rest, unable to get a full breath in, chest pressure 2. ONSET: "When did this breathing problem begin?"      Last night 3. PATTERN "Does the difficult breathing come and go, or has it been constant since it started?"      constant 4. SEVERITY: "How bad is your breathing?" (e.g., mild, moderate, severe)    - MILD: No SOB at rest, mild SOB with walking, speaks normally in sentences, can lay down, no retractions, pulse < 100.    - MODERATE: SOB at rest, SOB with minimal exertion and prefers to sit, cannot lie down flat, speaks in phrases, mild retractions, audible wheezing, pulse 100-120.    - SEVERE: Very SOB at rest, speaks in single words, struggling to breathe, sitting hunched forward, retractions, pulse > 120      moderate  8. CAUSE: "What do you think is causing the breathing problem?"     Covid severe sx 9. OTHER SYMPTOMS: "Do you have any other symptoms? (e.g., dizziness, runny nose, cough, chest pain, fever)     Chest pain, low oxygen saturation 85 % 10. PREGNANCY: "Is there any chance you are pregnant?" "When was your last menstrual period?"       no  Protocols used: BREATHING DIFFICULTY-A-AH

## 2019-12-04 NOTE — ED Notes (Signed)
Date and time results received: 12/04/19 1540 (use smartphrase ".now" to insert current time)  Test:Lactic Acid Critical Value: 3.1  Name of Provider Notified:B Henderly PA Orders Received? Or Actions Taken?: NA

## 2019-12-04 NOTE — Discharge Instructions (Signed)
Take the medications as prescribed.  You may monitor your oxygen levels at home.  At these are persistently in the 80s you need to return to the emergency department for reevaluation.  Make sure to drink plenty of fluids as you did not appear dehydrated today.  Follow up with PCP for reevaluation.

## 2019-12-04 NOTE — ED Triage Notes (Signed)
Patient presents to the ED with shortness of breath 95% room air.  Patient feels like he cannot take a deep breath.  Patient tested positive for covid on Tuesday.  No fever reported.  PCP put patient on steroids patient feels no better.

## 2019-12-04 NOTE — ED Provider Notes (Signed)
[facility] EMERGENCY DEPARTMENT Provider Note   CSN: [id number] Arrival date & time: 12/04/19  1324    History Chief Complaint  Patient presents with   Shortness of Breath    [name] is a 59 y.o. male with past medical history significant for diabetes, hypertension, hyperlipidemia, recent Covid diagnosis, 5 days PTA who presents for evaluation of shortness of breath.  Patient states he has had progressively worsening shortness of breath since his Covid diagnosis.  Prescribed ivermectin, azithromycin and prednisone by PCP.  Pain worse with movement.  Patient states also worse with laying flat because he feels like he coughs and cannot catch his breath when he lays flat due to nasal rhinorrhea.  He denies any chest pain, PND, orthopnea, lower extremity swelling, history of heart failure.  No fever, chills, nausea, vomiting, hemoptysis, domino pain, diarrhea, dysuria.  No rashes or lesions.  Denies additional aggravating or alleviating factors.   Hx of neuroendocrine CA. Removed 4 years ago.  History obtained from patient and past medical records.  No interpreter is used.  HPI     Past Medical History:  Diagnosis Date   COVID-19    Diabetes mellitus without complication ([place])    foot bone spur    GERD (gastroesophageal reflux disease)    Hyperlipidemia    Hypertension    Small bowel obstruction [facility])     Patient Active Problem List   Diagnosis Date Noted   Sebaceous cyst 01/20/2019   Special screening for malignant neoplasms, colon    Mass of pancreas    Pancreatic mass 02/25/2016   Ileus ([place]) 02/25/2016   Nausea with vomiting 02/25/2016   Dehydration 02/25/2016    Past Surgical History:  Procedure Laterality Date   COLONOSCOPY  2007   [facility]- sigmoid colon polyp and TI lymphoid hypoplasia but o/w no evidence of crohn's disease, no bx report available.    COLONOSCOPY N/A 08/31/2017   Procedure: COLONOSCOPY;  Surgeon: Danie Binder, MD;   Location: [facility] ENDO [facility]ITE;  Service: Endoscopy;  Laterality: N/A;  [place] CAPSULE STUDY  10/12/2006   there was no evidence of active GI bleeding, inflammation or erosions on this study   [place]     as a child   Pancreatic cancer surgery     TONSILLECTOMY         Family History  Problem Relation Age of Onset   Stroke Mother    Hypertension Mother    Diabetes Mother    CAD Mother    Colon cancer Neg Hx    Colon polyps Neg Hx     Social History   Tobacco Use   Smoking status: Never Smoker   Smokeless tobacco: Current User    Types: Snuff  Substance Use Topics   Alcohol use: Not Currently    Alcohol/week: 1.0 standard drinks    Types: 1 Cans of beer per week   Drug use: No    Home Medications Prior to Admission medications   Medication Sig Start Date End Date Taking? Authorizing Provider  albuterol (VENTOLIN HFA) 108 (90 Base) MCG/ACT inhaler Inhale 2 puffs into the lungs every 4 (four) hours as needed for wheezing or shortness of breath. 12/04/19   Cynthya Yam A, PA-C  amLODipine-valsartan (EXFORGE) 10-320 MG tablet Take 1 tablet by mouth daily. 01/18/19   Strader, Fransisco Hertz, PA-C  aspirin EC 81 MG tablet Take 81 mg by mouth daily.  [provider]  azithromycin (ZITHROMAX) 250 MG tablet Take 1 tablet by mouth as directed. 11/28/19   [provider]  benzonatate (TESSALON) 100 MG capsule Take 1 capsule (100 mg total) by mouth every 8 (eight) hours. 12/04/19   Jayshaun Phillips A, PA-C  dexamethasone (DECADRON) 6 MG tablet Take 1 tablet (6 mg total) by mouth daily for 6 days. 12/04/19 12/10/19  Rasool Rommel A, PA-C  furosemide (LASIX) 20 MG tablet Take 1 tablet by mouth daily. 07/31/17   [provider]  hydrochlorothiazide (HYDRODIURIL) 25 MG tablet Take 25 mg by mouth daily.    [provider]  insulin glargine (LANTUS) 100 UNIT/ML injection Inject 61 Units into the skin every morning.      [provider]  ivermectin (STROMECTOL) 3 MG TABS tablet Take 7 tablets by mouth daily. For 3 days 11/29/19   [provider]  lansoprazole (PREVACID) 15 MG capsule Take 15 mg by mouth daily.     [provider]  lisinopril (ZESTRIL) 5 MG tablet Take 5 mg by mouth daily. 11/09/19   [provider]  metFORMIN (GLUCOPHAGE) 1000 MG tablet Take 1,000 mg by mouth 2 (two) times daily. 11/09/19   [provider]  metFORMIN (GLUCOPHAGE) 500 MG tablet Take 500-1,000 mg by mouth See admin instructions. (2500 mg daily) 1000 mg  am  500 mg lunch 1000 mg pm    [provider]  methylPREDNISolone (MEDROL DOSEPAK) 4 MG TBPK tablet Take 1 tablet by mouth as directed. follow package directions 6,5,4,3,2,1 11/28/19   [provider]  Multiple Vitamins-Minerals (MULTIVITAMIN WITH MINERALS) tablet Take 1 tablet by mouth daily.    [provider]  simvastatin (ZOCOR) 40 MG tablet  01/05/19   [provider]    Allergies    Cortisone and Penicillins  Review of Systems   Review of Systems  Constitutional: Negative.   HENT: Negative.   Respiratory: Positive for cough and shortness of breath. Negative for apnea, choking, chest tightness, wheezing and stridor.   Cardiovascular: Negative.   Gastrointestinal: Negative.   Endocrine: Negative.   Genitourinary: Negative.   Musculoskeletal: Negative.   Skin: Negative.   Neurological: Negative.   All other systems reviewed and are negative.   Physical Exam Updated Vital Signs BP (!) 121/59    Pulse 97    Temp 97.8 F (36.6 C) (Oral)    Resp (!) 23    Ht 5\' 11"  (1.803 m)    Wt 112.5 kg    SpO2 94%    BMI 34.59 kg/m   Physical Exam Vitals and nursing note reviewed.  Constitutional:      General: He is not in acute distress.    Appearance: He is not ill-appearing, toxic-appearing or diaphoretic.  HENT:     Head: Normocephalic and atraumatic.     Jaw: There is normal jaw  occlusion.     Right Ear: Tympanic membrane, ear canal and external ear normal. There is no impacted cerumen. No hemotympanum. Tympanic membrane is not injected, scarred, perforated, erythematous, retracted or bulging.     Left Ear: Tympanic membrane, ear canal and external ear normal. There is no impacted cerumen. No hemotympanum. Tympanic membrane is not injected, scarred, perforated, erythematous, retracted or bulging.     Ears:     Comments: No Mastoid tenderness.    Nose:     Comments: Clear rhinorrhea and congestion to bilateral nares.  No sinus tenderness.    Mouth/Throat:     Comments:  Posterior oropharynx clear.  Mucous membranes moist.  Tonsils without erythema or exudate.  Uvula midline without deviation.  No evidence of PTA or RPA.  No drooling, dysphasia or trismus.  Phonation normal. Neck:     Trachea: Trachea and phonation normal.     Meningeal: Brudzinski's sign and Kernig's sign absent.     Comments: No Neck stiffness or neck rigidity.  No meningismus.  No cervical lymphadenopathy. Cardiovascular:     Comments: No murmurs rubs or gallops. Pulmonary:     Comments: Clear to auscultation bilaterally without wheeze, rhonchi or rales.  No accessory muscle usage.  Able speak in full sentences. Abdominal:     Comments: Soft, nontender without rebound or guarding.  No CVA tenderness.  Musculoskeletal:     Comments: Moves all 4 extremities without difficulty.  Lower extremities without edema, erythema or warmth.  Skin:    Comments: Brisk capillary refill.  No rashes or lesions.  Neurological:     Mental Status: He is alert.     Comments: Ambulatory in department without difficulty.  Cranial nerves II through XII grossly intact.  No facial droop.  No aphasia.     ED Results / Procedures / Treatments   Labs (all labs ordered are listed, but only abnormal results are displayed) Labs Reviewed  LACTIC ACID, PLASMA - Abnormal; Notable for the following components:      Result  Value   Lactic Acid, Venous 3.1 (*)    All other components within normal limits  LACTIC ACID, PLASMA - Abnormal; Notable for the following components:   Lactic Acid, Venous 3.0 (*)    All other components within normal limits  CBC WITH DIFFERENTIAL/PLATELET - Abnormal; Notable for the following components:   WBC 12.6 (*)    Neutro Abs 9.4 (*)    Monocytes Absolute 1.4 (*)    Abs Immature Granulocytes 0.16 (*)    All other components within normal limits  COMPREHENSIVE METABOLIC PANEL - Abnormal; Notable for the following components:   Potassium 3.3 (*)    CO2 21 (*)    Glucose, Bld 168 (*)    BUN 34 (*)    AST 45 (*)    ALT 67 (*)    Anion gap 16 (*)    All other components within normal limits  TRIGLYCERIDES - Abnormal; Notable for the following components:   Triglycerides 210 (*)    All other components within normal limits  FIBRINOGEN - Abnormal; Notable for the following components:   Fibrinogen 568 (*)    All other components within normal limits  C-REACTIVE PROTEIN - Abnormal; Notable for the following components:   CRP 5.9 (*)    All other components within normal limits  CBG MONITORING, ED - Abnormal; Notable for the following components:   Glucose-Capillary 147 (*)    All other components within normal limits  CULTURE, BLOOD (ROUTINE X 2)  CULTURE, BLOOD (ROUTINE X 2)  D-DIMER, QUANTITATIVE (NOT AT [facility])  PROCALCITONIN  LACTATE DEHYDROGENASE  FERRITIN  TROPONIN I (HIGH SENSITIVITY)  TROPONIN I (HIGH SENSITIVITY)    EKG None  Radiology DG Chest Port 1 View  Result Date: 12/04/2019 CLINICAL DATA:  Shortness of breath. EXAM: PORTABLE CHEST 1 VIEW COMPARISON:  Chest x-ray dated 12/17/2018. FINDINGS: Heart size and mediastinal contours are within normal limits. Subtle peripheral airspace opacities bilaterally. No pleural effusion or pneumothorax is seen. Osseous structures about the chest are unremarkable. IMPRESSION: Suspected early multifocal/bilateral  pneumonia. Electronically Signed   By: Cherlynn Kaiser  Enriqueta Shutter M.D.   On: 12/04/2019 14:20    Procedures Procedures (including critical care time)  Medications Ordered in ED Medications  sodium chloride 0.9 % bolus 1,000 mL (has no administration in time range)  albuterol (VENTOLIN HFA) 108 (90 Base) MCG/ACT inhaler 6 puff (6 puffs Inhalation Given 12/04/19 1441)  sodium chloride 0.9 % bolus 1,000 mL (0 mLs Intravenous Stopped 12/04/19 1735)    ED Course  I have reviewed the triage vital signs and the nursing notes.  Pertinent labs & imaging results that were available during my care of the patient were reviewed by me and considered in my medical decision making (see chart for details).  59 year old male, known Covid presents for evaluation of shortness of breath.  He is afebrile, nonseptic, non-ill-appearing.  No history of PE, DVT however does have remote history of neuroendocrine cancer, surgically removed 4 years ago.  Shortness of breath worse when he lays down because he feels like he coughs as well as with exertion.  No chest pain.  Heart and lungs clear.  Recently completed ivermectin, azithromycin and prednisone from PCP.  No evidence of fluid overload on exam, no weight changes or lower extremity edema per patient. No prior history of heart failure.  Denies PND and orthopnea.  Plan for labs, chest x-ray, EKG, breathing treatment and reevaluation.  Clinical Course as of Dec 04 1811  Sun Dec 04, 2019  1518 Mild leukocytosis to 12.6.,  Hgb 14.9  CBC WITH DIFFERENTIAL(!) [BH]  1518 Suspected early multifocal, early bilateral pneumonia.  Given known Covid diagnosis, likely viral in etiology.  DG Chest Port 1 View [BH]  1537 Ddimer not elevated  D-dimer, quantitative [BH]  1537 Mild hyperglycemia, mildly elevated LFTs, anion gap 16  Comprehensive metabolic panel(!) [BH]  [id number] Elevated 3.1, question dehydration given elevated anion gap as well  Lactic acid, plasma(!!) [BH]  1747 Delta trop  flat  Troponin I (High Sensitivity) [BH]    Clinical Course User Index [BH] Arabel Barcenas A, PA-C   Patient reassessed.  Shortness of breath improved with albuterol.  Does have some mild tachypnea to low 20s interim however he is not hypoxic, patient 98% on room air while he was speaking with good waveform.  No chest pain.  No evidence of DVT on exam.  Does have a lactic acidosis however unfortunately on repeat patient had only gotten on even a quarter of his fluids.  This is likely due to dehydration as he appears clinically dry has had a normal appetite at home.  He is well-hydrated here in the emergency department.  I have low suspicion for sepsis at this time.  His heart rate has improved with some fluids.  Will start outpatient Tessalon Perles, steroids, butyryl.  He does have a home pulse ox.  Discussed with him strict return precautions.  His orthostatic vital signs were negative here in the emergency department.  He ambulated with oxygen saturations 95% on room air without any dyspnea.  Does not appear fluid overloaded and chest x-ray does not show any evidence of CHF.  EKG not falling over into computer however sinus tachycardia to 100, no ST/T changes.  Low suspicion for ACS, PE, dissection, myocarditis, pericarditis as cause of symptoms.  The patient has been appropriately medically screened and/or stabilized in the ED. I have low suspicion for any other emergent medical condition which would require further screening, evaluation or treatment in the ED or require inpatient management.  Patient is hemodynamically stable and in no acute  distress.  Patient able to ambulate in department prior to ED.  Evaluation does not show acute pathology that would require ongoing or additional emergent interventions while in the emergency department or further inpatient treatment.  I have discussed the diagnosis with the patient and answered all questions.  Pain is been managed while in the emergency  department and patient has no further complaints prior to discharge.  Patient is comfortable with plan discussed in room and is stable for discharge at this time.  I have discussed strict return precautions for returning to the emergency department.  Patient was encouraged to follow-up with PCP/specialist refer to at discharge.   MDM Rules/Calculators/A&P                      JAREMIAH HEUBERGER was evaluated in Emergency Department on 12/04/2019 for the symptoms described in the history of present illness. He was evaluated in the context of the global COVID-19 pandemic, which necessitated consideration that the patient might be at risk for infection with the SARS-CoV-2 virus that causes COVID-19. Institutional protocols and algorithms that pertain to the evaluation of patients at risk for COVID-19 are in a state of rapid change based on information released by regulatory bodies including the CDC and federal and state organizations. These policies and algorithms were followed during the patient's care in the ED. Final Clinical Impression(s) / ED Diagnoses Final diagnoses:  COVID-19  SOB (shortness of breath)    Rx / DC Orders ED Discharge Orders         Ordered    dexamethasone (DECADRON) 6 MG tablet  Daily     12/04/19 1805    albuterol (VENTOLIN HFA) 108 (90 Base) MCG/ACT inhaler  Every 4 hours PRN     12/04/19 1805    benzonatate (TESSALON) 100 MG capsule  Every 8 hours     12/04/19 1805           Malayia Spizzirri A, PA-C 12/04/19 1813    Dorie Rank, MD 12/06/19 1239

## 2019-12-07 DIAGNOSIS — E119 Type 2 diabetes mellitus without complications: Secondary | ICD-10-CM | POA: Diagnosis not present

## 2019-12-07 DIAGNOSIS — R0600 Dyspnea, unspecified: Secondary | ICD-10-CM | POA: Diagnosis not present

## 2019-12-07 DIAGNOSIS — J9801 Acute bronchospasm: Secondary | ICD-10-CM | POA: Diagnosis not present

## 2019-12-07 DIAGNOSIS — J1282 Pneumonia due to coronavirus disease 2019: Secondary | ICD-10-CM | POA: Diagnosis not present

## 2019-12-09 LAB — CULTURE, BLOOD (ROUTINE X 2)
Culture: NO GROWTH
Culture: NO GROWTH
Special Requests: ADEQUATE
Special Requests: ADEQUATE

## 2020-01-02 DIAGNOSIS — R7889 Finding of other specified substances, not normally found in blood: Secondary | ICD-10-CM | POA: Diagnosis not present

## 2020-01-02 DIAGNOSIS — R7989 Other specified abnormal findings of blood chemistry: Secondary | ICD-10-CM | POA: Diagnosis not present

## 2020-01-02 DIAGNOSIS — Z23 Encounter for immunization: Secondary | ICD-10-CM | POA: Diagnosis not present

## 2020-01-11 ENCOUNTER — Other Ambulatory Visit: Payer: Self-pay | Admitting: Student

## 2020-01-31 DIAGNOSIS — N529 Male erectile dysfunction, unspecified: Secondary | ICD-10-CM | POA: Diagnosis not present

## 2020-01-31 DIAGNOSIS — E119 Type 2 diabetes mellitus without complications: Secondary | ICD-10-CM | POA: Diagnosis not present

## 2020-01-31 DIAGNOSIS — K219 Gastro-esophageal reflux disease without esophagitis: Secondary | ICD-10-CM | POA: Diagnosis not present

## 2020-02-21 DIAGNOSIS — R7989 Other specified abnormal findings of blood chemistry: Secondary | ICD-10-CM | POA: Diagnosis not present

## 2020-02-29 DIAGNOSIS — E1165 Type 2 diabetes mellitus with hyperglycemia: Secondary | ICD-10-CM | POA: Diagnosis not present

## 2020-02-29 DIAGNOSIS — Z6836 Body mass index (BMI) 36.0-36.9, adult: Secondary | ICD-10-CM | POA: Diagnosis not present

## 2020-02-29 DIAGNOSIS — Z1389 Encounter for screening for other disorder: Secondary | ICD-10-CM | POA: Diagnosis not present

## 2020-02-29 DIAGNOSIS — K219 Gastro-esophageal reflux disease without esophagitis: Secondary | ICD-10-CM | POA: Diagnosis not present

## 2020-02-29 DIAGNOSIS — H01004 Unspecified blepharitis left upper eyelid: Secondary | ICD-10-CM | POA: Diagnosis not present

## 2020-03-05 DIAGNOSIS — E119 Type 2 diabetes mellitus without complications: Secondary | ICD-10-CM | POA: Diagnosis not present

## 2020-04-12 DIAGNOSIS — E1165 Type 2 diabetes mellitus with hyperglycemia: Secondary | ICD-10-CM | POA: Diagnosis not present

## 2020-04-12 DIAGNOSIS — E291 Testicular hypofunction: Secondary | ICD-10-CM | POA: Diagnosis not present

## 2020-04-12 DIAGNOSIS — I10 Essential (primary) hypertension: Secondary | ICD-10-CM | POA: Diagnosis not present

## 2020-04-12 DIAGNOSIS — Z6836 Body mass index (BMI) 36.0-36.9, adult: Secondary | ICD-10-CM | POA: Diagnosis not present

## 2020-04-12 DIAGNOSIS — K219 Gastro-esophageal reflux disease without esophagitis: Secondary | ICD-10-CM | POA: Diagnosis not present

## 2020-04-12 DIAGNOSIS — I700 Atherosclerosis of aorta: Secondary | ICD-10-CM | POA: Diagnosis not present

## 2020-04-12 DIAGNOSIS — Z0001 Encounter for general adult medical examination with abnormal findings: Secondary | ICD-10-CM | POA: Diagnosis not present

## 2020-04-19 DIAGNOSIS — H25013 Cortical age-related cataract, bilateral: Secondary | ICD-10-CM | POA: Diagnosis not present

## 2020-04-19 DIAGNOSIS — H18413 Arcus senilis, bilateral: Secondary | ICD-10-CM | POA: Diagnosis not present

## 2020-04-19 DIAGNOSIS — H2512 Age-related nuclear cataract, left eye: Secondary | ICD-10-CM | POA: Diagnosis not present

## 2020-04-19 DIAGNOSIS — H2513 Age-related nuclear cataract, bilateral: Secondary | ICD-10-CM | POA: Diagnosis not present

## 2020-04-19 DIAGNOSIS — H25043 Posterior subcapsular polar age-related cataract, bilateral: Secondary | ICD-10-CM | POA: Diagnosis not present

## 2020-05-02 DIAGNOSIS — H2512 Age-related nuclear cataract, left eye: Secondary | ICD-10-CM | POA: Diagnosis not present

## 2020-05-03 DIAGNOSIS — H2511 Age-related nuclear cataract, right eye: Secondary | ICD-10-CM | POA: Diagnosis not present

## 2020-05-03 DIAGNOSIS — E782 Mixed hyperlipidemia: Secondary | ICD-10-CM | POA: Diagnosis not present

## 2020-05-03 DIAGNOSIS — Z6836 Body mass index (BMI) 36.0-36.9, adult: Secondary | ICD-10-CM | POA: Diagnosis not present

## 2020-05-03 DIAGNOSIS — E6609 Other obesity due to excess calories: Secondary | ICD-10-CM | POA: Diagnosis not present

## 2020-05-23 DIAGNOSIS — H2511 Age-related nuclear cataract, right eye: Secondary | ICD-10-CM | POA: Diagnosis not present

## 2020-05-24 DIAGNOSIS — R7989 Other specified abnormal findings of blood chemistry: Secondary | ICD-10-CM | POA: Diagnosis not present

## 2020-08-07 ENCOUNTER — Encounter: Payer: Self-pay | Admitting: General Surgery

## 2020-08-07 ENCOUNTER — Other Ambulatory Visit: Payer: Self-pay

## 2020-08-07 ENCOUNTER — Ambulatory Visit (INDEPENDENT_AMBULATORY_CARE_PROVIDER_SITE_OTHER): Payer: Commercial Managed Care - PPO | Admitting: General Surgery

## 2020-08-07 ENCOUNTER — Ambulatory Visit: Payer: Commercial Managed Care - PPO | Admitting: General Surgery

## 2020-08-07 VITALS — BP 148/103 | HR 80 | Temp 98.4°F | Resp 18 | Ht 71.0 in | Wt 250.0 lb

## 2020-08-07 DIAGNOSIS — E118 Type 2 diabetes mellitus with unspecified complications: Secondary | ICD-10-CM | POA: Diagnosis not present

## 2020-08-07 DIAGNOSIS — Z23 Encounter for immunization: Secondary | ICD-10-CM | POA: Diagnosis not present

## 2020-08-07 DIAGNOSIS — E7849 Other hyperlipidemia: Secondary | ICD-10-CM | POA: Diagnosis not present

## 2020-08-07 DIAGNOSIS — L723 Sebaceous cyst: Secondary | ICD-10-CM | POA: Diagnosis not present

## 2020-08-07 DIAGNOSIS — I10 Essential (primary) hypertension: Secondary | ICD-10-CM | POA: Diagnosis not present

## 2020-08-07 DIAGNOSIS — E291 Testicular hypofunction: Secondary | ICD-10-CM | POA: Diagnosis not present

## 2020-08-07 DIAGNOSIS — Z6835 Body mass index (BMI) 35.0-35.9, adult: Secondary | ICD-10-CM | POA: Diagnosis not present

## 2020-08-07 NOTE — Patient Instructions (Addendum)
Take some tylenol 1000 mg prior to coming to clinic for the procedure.   Epidermal (Sebaceous) Cyst Removal  Epidermal cyst removal is a procedure to remove a sac of oily material (keratin) that has formed under your skin (epidermal cyst). Epidermal cysts may also be called epidermoid cysts, sebaceous cysts, or keratin cysts. Normally, the skin secretes this oily material through a gland or a hair follicle. However, when a skin gland or hair follicle becomes blocked, an epidermal cyst can form. You may need this procedure if you have an epidermal cyst that becomes large, uncomfortable, or infected. Tell a health care provider about:  Any allergies you have.  All medicines you are taking, including vitamins, herbs, eye drops, creams, and over-the-counter medicines.  Any problems you or family members have had with anesthetic medicines.  Any blood disorders you have.  Any surgeries you have had.  Any medical conditions you have now or have had.  Whether you are pregnant or may be pregnant. What are the risks? Generally, this is a safe procedure. However, problems may occur, including:  Development of another cyst.  Bleeding.  Infection.  Scarring. What happens before the procedure?  Ask your health care provider about: ? Changing or stopping your regular medicines. This is especially important if you are taking diabetes medicines or blood thinners. ? Taking medicines such as aspirin and ibuprofen. These medicines can thin your blood. Do not take these medicines unless your health care provider tells you to take them. ? Taking over-the-counter medicines, vitamins, herbs, and supplements.  If you have an infected cyst, you may have to take antibiotic medicine before the cyst removal. Take your antibiotic as told by your health care provider. Do not stop taking the antibiotic even if you start to feel better.  Take a shower on the morning of your procedure. Your health care  provider may ask you to use a germ-killing soap. What happens during the procedure?   You will be given a medicine to numb the area (local anesthetic).  The skin around the cyst will be cleaned with a germ-killing solution.  The health care provider will make a small incision in your skin over the cyst.  The health care provider will separate the cyst from the surrounding tissues that are under your skin.  If possible, the cyst will be removed undamaged (intact).  If the cyst bursts (ruptures), it will be removed in pieces.  After the cyst is removed, the health care provider will control any bleeding and close the incision with small stitches (sutures). Small incisions may not need sutures, and the bleeding will be controlled by applying direct pressure with gauze.  The health care provider may apply antibiotic ointment and a bandage (dressing) over the incision. The procedure may vary among health care providers and hospitals. What happens after the procedure?  If your cyst ruptured during the procedure, you may need an antibiotic. If you are prescribed an antibiotic medicine or ointment, take or apply it as told by your health care provider. Do not stop using the antibiotic even if you start to feel better. Summary  Epidermal cyst removal is a procedure to remove a sac of oily material (keratin) that has formed under your skin (epidermal cyst).  You may need this procedure if you have an epidermal cyst that becomes large, uncomfortable, or infected.  The health care provider will make a small incision in your skin to remove the cyst.  If you are prescribed an antibiotic  medicine before the procedure, after the procedure, or both, use the antibiotic as told by your health care provider. Do not stop using the antibiotic even if you start to feel better. This information is not intended to replace advice given to you by your health care provider. Make sure you discuss any questions  you have with your health care provider. Document Revised: 02/17/2019 Document Reviewed: 08/20/2017 Elsevier Patient Education  [place].

## 2020-08-07 NOTE — Progress Notes (Signed)
[facility] History and Physical  Reason for Referral: Left neck cyst Referring Physician: Self referral  Chief Complaint    Follow-up      [name] is a 59 y.o. male.  HPI: [name] is a 59 yo who I saw about 18 months ago with a superficial left neck cyst versus lipoma that had been there for years. He says that it has not really bothered him but that his family talks about it all the time and his mother things it needs to be removed. He says it is mostly annoying to him, and he wants to get it removed so people will stop commenting on it.  He denies ever having infection or drainage from the area. It has been present for multiple years. He has a history of a pancreatic neuroendocrine cancer s/p surgery at [facility]. He also says he has had an umbilical hernia that has formed. He follows with [facility] annually and is due to see them soon.   Past Medical History:  Diagnosis Date   COVID-19    Diabetes mellitus without complication ([place])    foot bone spur    GERD (gastroesophageal reflux disease)    Hyperlipidemia    Hypertension    Small bowel obstruction ([place])     Past Surgical History:  Procedure Laterality Date   COLONOSCOPY  2007   [facility]- sigmoid colon polyp and TI lymphoid hypoplasia but o/w no evidence of crohn's disease, no bx report available.    COLONOSCOPY N/A 08/31/2017   Procedure: COLONOSCOPY;  Surgeon: Danie Binder, MD;  Location: [facility]O [facility];  Service: Endoscopy;  Laterality: N/A;  [place] CAPSULE STUDY  10/12/2006   there was no evidence of active GI bleeding, inflammation or erosions on this study   HERNIA REPAIR     as a child   Pancreatic cancer surgery     TONSILLECTOMY      Family History  Problem Relation Age of Onset   Stroke Mother    Hypertension Mother    Diabetes Mother    CAD Mother    Colon cancer Neg Hx    Colon polyps Neg Hx     Social History   Tobacco Use     Smoking status: Never Smoker   Smokeless tobacco: Current User    Types: Snuff  Vaping Use   Vaping Use: Never used  Substance Use Topics   Alcohol use: Not Currently    Alcohol/week: 1.0 standard drink    Types: 1 Cans of beer per week   Drug use: No    Medications: I have reviewed the patient's current medications. Allergies as of 08/07/2020      Reactions   Cortisone Other (See Comments)   Hypes pt up - unable to sleep for days at a time   Penicillins Rash   Has patient had a PCN reaction causing immediate rash, facial/tongue/throat swelling, SOB or lightheadedness with hypotension: Yes Has patient had a PCN reaction causing severe rash involving mucus membranes or skin necrosis: No Has patient had a PCN reaction that required hospitalization No Has patient had a PCN reaction occurring within the last 10 years: No If all of the above answers are "NO", then may proceed with Cephalosporin use.      Medication List       Accurate as of August 07, 2020 10:10 AM. If you have any questions, ask your nurse or  doctor.        STOP taking these medications   albuterol 108 (90 Base) MCG/ACT inhaler Commonly known as: VENTOLIN HFA Stopped by: Virl Cagey, MD   azithromycin 250 MG tablet Commonly known as: ZITHROMAX Stopped by: Virl Cagey, MD   benzonatate 100 MG capsule Commonly known as: TESSALON Stopped by: Virl Cagey, MD   ivermectin 3 MG Tabs tablet Commonly known as: STROMECTOL Stopped by: Virl Cagey, MD   methylPREDNISolone 4 MG Tbpk tablet Commonly known as: MEDROL DOSEPAK Stopped by: Virl Cagey, MD     TAKE these medications   amLODipine-valsartan 10-320 MG tablet Commonly known as: EXFORGE TAKE 1 TABLET BY MOUTH DAILY   aspirin EC 81 MG tablet Take 81 mg by mouth daily.   furosemide 20 MG tablet Commonly known as: LASIX Take 1 tablet by mouth daily.   hydrochlorothiazide 25 MG tablet Commonly known as:  HYDRODIURIL Take 25 mg by mouth daily.   insulin glargine 100 UNIT/ML injection Commonly known as: LANTUS Inject 76 Units into the skin every morning.   lansoprazole 15 MG capsule Commonly known as: PREVACID Take 15 mg by mouth daily.   lisinopril 5 MG tablet Commonly known as: ZESTRIL Take 5 mg by mouth daily.   metFORMIN 500 MG tablet Commonly known as: GLUCOPHAGE Take 500-1,000 mg by mouth See admin instructions. (2500 mg daily) 1000 mg  am  500 mg lunch 1000 mg pm What changed: Another medication with the same name was removed. Continue taking this medication, and follow the directions you see here. Changed by: Virl Cagey, MD   multivitamin with minerals tablet Take 1 tablet by mouth daily.   simvastatin 40 MG tablet Commonly known as: ZOCOR        ROS:  A comprehensive review of systems was negative except for: Gastrointestinal: positive for umbilical hernia Musculoskeletal: positive for left neck cyst  Blood pressure (!) 148/103, pulse 80, temperature 98.4 F (36.9 C), temperature source Oral, resp. rate 18, height 5\' 11"  (1.803 m), weight 250 lb (113.4 kg), SpO2 95 %. Physical Exam Vitals reviewed.  Constitutional:      Appearance: Normal appearance.  HENT:     Head: Normocephalic and atraumatic.     Nose: Nose normal.     Mouth/Throat:     Mouth: Mucous membranes are moist.  Eyes:     Pupils: Pupils are equal, round, and reactive to light.  Neck:     Comments: Left posterior neck, superficial 1-1.5cm lesion Cardiovascular:     Rate and Rhythm: Normal rate.  Pulmonary:     Effort: Pulmonary effort is normal.     Breath sounds: Normal breath sounds.  Abdominal:     General: There is no distension.     Palpations: Abdomen is soft.     Tenderness: There is no abdominal tenderness.     Hernia: A hernia is present. Hernia is present in the umbilical area.     Comments: Reducible, 2cm defect  Musculoskeletal:        General: No swelling. Normal  range of motion.     Cervical back: Normal range of motion.  Skin:    General: Skin is warm and dry.  Neurological:     General: No focal deficit present.     Mental Status: He is alert and oriented to person, place, and time.  Psychiatric:        Mood and Affect: Mood normal.  Behavior: Behavior normal.        Thought Content: Thought content normal.        Judgment: Judgment normal.     Results: None   Assessment & Plan:  [name] is a 59 y.o. male with a known cyst versus lipoma on the left neck that is chronic and mostly bothers him because people call attention to it. He has had it for years. He also has a umbilical hernia after his surgery at [facility]. He is due to see Dr. Carlis Abbott for his follow up and imaging for the neuroendocrine cancer.   -Local excision of the left neck lesion in the office next week  -Would follow up with Dr. Carlis Abbott and get his approval for umbilical hernia repair/ ensure no concern on the most recent follow up for cancer before repair  -If he wants [place] to repair umbilical hernia that can be done, just let us know    All questions were answered to the satisfaction of the patient.  Discussed risk of cyst excision including bleeding, infection, findings something other than cyst or lipoma, sending to pathology.   Future Appointments  Date Time Provider [place]  08/16/2020  3:15 PM Virl Cagey, MD [facility] None     Virl Cagey 08/07/2020, 10:10 AM

## 2020-08-16 ENCOUNTER — Other Ambulatory Visit: Payer: Self-pay

## 2020-08-16 ENCOUNTER — Other Ambulatory Visit: Payer: Self-pay | Admitting: General Surgery

## 2020-08-16 ENCOUNTER — Encounter: Payer: Self-pay | Admitting: General Surgery

## 2020-08-16 ENCOUNTER — Ambulatory Visit (INDEPENDENT_AMBULATORY_CARE_PROVIDER_SITE_OTHER): Payer: Commercial Managed Care - PPO | Admitting: General Surgery

## 2020-08-16 ENCOUNTER — Other Ambulatory Visit: Payer: Self-pay | Admitting: Family Medicine

## 2020-08-16 VITALS — BP 162/93 | HR 93 | Temp 97.7°F | Resp 18 | Ht 71.0 in | Wt 258.0 lb

## 2020-08-16 DIAGNOSIS — D170 Benign lipomatous neoplasm of skin and subcutaneous tissue of head, face and neck: Secondary | ICD-10-CM

## 2020-08-16 DIAGNOSIS — L723 Sebaceous cyst: Secondary | ICD-10-CM

## 2020-08-16 MED ORDER — OXYCODONE HCL 5 MG PO TABS: 5.0000 mg | ORAL_TABLET | ORAL | 0 refills | Status: DC | PRN

## 2020-08-16 NOTE — Patient Instructions (Addendum)
Keep area covered with dry bandage. Ok to shower and pat dry. Can place neosporin to area if desired.   Will take out stitches in 1 week.  Take Ibuprofen and tylenol for pain and Roxicodone for break through pain.

## 2020-08-16 NOTE — Progress Notes (Signed)
[facility] Operative Note  08/16/20  Preoperative Diagnosis:  Left neck cyst 1.5cm   Postoperative Diagnosis: Left neck lipoma 1.5cm    Procedure(s) Performed: Excision of left neck mass 1.5cm    Surgeon: Lanell Matar. Constance Haw, MD   Assistants: No qualified resident was available    Anesthesia: 1% Xylocaine    Specimens: Left neck mass    Estimated Blood Loss: Minimal   Blood Replacement: None    Complications: None   Wound Class: Clean    Operative Indications: [name] is a 59 yo with a growing left neck mass that we felt was likely a dermoid/ sebaceous cyst. We discussed excision and the risk of bleeding, infection, recurrence or finding something other than a cyst.   Findings: Left neck fatty mass, likely lipoma    Procedure: The patient was taken to the procedure room and placed on his right side with the left neck exposed. The left neck was prepped with betadine and draped in the normal sterile fashion. Xylocaine 1% was used to anesthetize the area.  An incision was made over the prominent part and carried down through the subcutaneous tissue. Using a combination of sharp and blunt dissection a 1.5cm fatty mass consistent with a lipoma with digitations was excised. Hemostasis was confirmed. The deep layer was closed with 4-0 Monocryl interrupted and the skin was closed with 4-0 Nylon interrupted. A sterile bandaid was placed.  Final inspection revealed acceptable hemostasis.   Rx for Roxicodone 5mg  q 4 PRN (#5) was sent in  Keep area covered with dry bandage. Ok to shower and pat dry. Can place neosporin to area if desired.   Will take out stitches in 1 week.  Take Ibuprofen and tylenol for pain and Roxicodone for break through pain.     Curlene Labrum, MD [facility] [address] [place], [place] [address] [phone] (office)

## 2020-08-23 ENCOUNTER — Encounter: Payer: Self-pay | Admitting: General Surgery

## 2020-08-23 ENCOUNTER — Other Ambulatory Visit: Payer: Self-pay

## 2020-08-23 ENCOUNTER — Ambulatory Visit (INDEPENDENT_AMBULATORY_CARE_PROVIDER_SITE_OTHER): Payer: Commercial Managed Care - PPO | Admitting: General Surgery

## 2020-08-23 VITALS — BP 156/89 | HR 88 | Temp 98.2°F | Resp 16 | Ht 71.0 in | Wt 254.0 lb

## 2020-08-23 DIAGNOSIS — D170 Benign lipomatous neoplasm of skin and subcutaneous tissue of head, face and neck: Secondary | ICD-10-CM

## 2020-08-24 NOTE — Progress Notes (Signed)
[facility] Note   HPI:  59 y.o. Male presents to clinic for post-op follow-up evaluation of his left neck incision. He is doing well and the area is healing.  Review of Systems:  No fever or chills No redness No numbness or pain All other review of systems: otherwise negative   Pathology: Diagnosis Soft tissue, lipoma, left neck superficial fatty mass - MATURE ADIPOSE TISSUE CONSISTENT WITH LIPOMA.  Vital Signs:  BP (!) 156/89   Pulse 88   Temp 98.2 F (36.8 C) (Oral)   Resp 16   Ht 5\' 11"  (1.803 m)   Wt 254 lb (115.2 kg)   SpO2 96%   BMI 35.43 kg/m    Physical Exam:  Physical Exam Vitals reviewed.  Neck:     Comments: Neck incision on left healing, sutures removed, steri strips placed, no erythema or drainage Cardiovascular:     Rate and Rhythm: Normal rate.  Pulmonary:     Effort: Pulmonary effort is normal.     Assessment:  59 y.o. yo Male s/p excision of a lipoma. Doing well overall.  Plan:  Steri strips will peel off in 5-7 days Call with issues PRN follow up   Curlene Labrum, MD [facility] [place], [place] [address] [phone] (office)

## 2020-08-29 ENCOUNTER — Other Ambulatory Visit: Payer: Self-pay | Admitting: General Surgery

## 2020-08-29 DIAGNOSIS — E3400 Carcinoid syndrome, unspecified: Secondary | ICD-10-CM

## 2020-08-29 DIAGNOSIS — C7A8 Other malignant neuroendocrine tumors: Secondary | ICD-10-CM

## 2020-09-11 ENCOUNTER — Encounter: Payer: Self-pay | Admitting: Internal Medicine

## 2020-09-24 DIAGNOSIS — E119 Type 2 diabetes mellitus without complications: Secondary | ICD-10-CM | POA: Diagnosis not present

## 2020-12-17 ENCOUNTER — Other Ambulatory Visit: Payer: Self-pay | Admitting: Student

## 2020-12-20 IMAGING — DX DG CHEST 1V PORT
1 series · 1 of 1 positions shown · non-contrast
Comparison: Chest x-ray dated 12/17/2018.

CLINICAL DATA: Shortness of breath.

EXAM:
PORTABLE CHEST 1 VIEW

[chest ap]
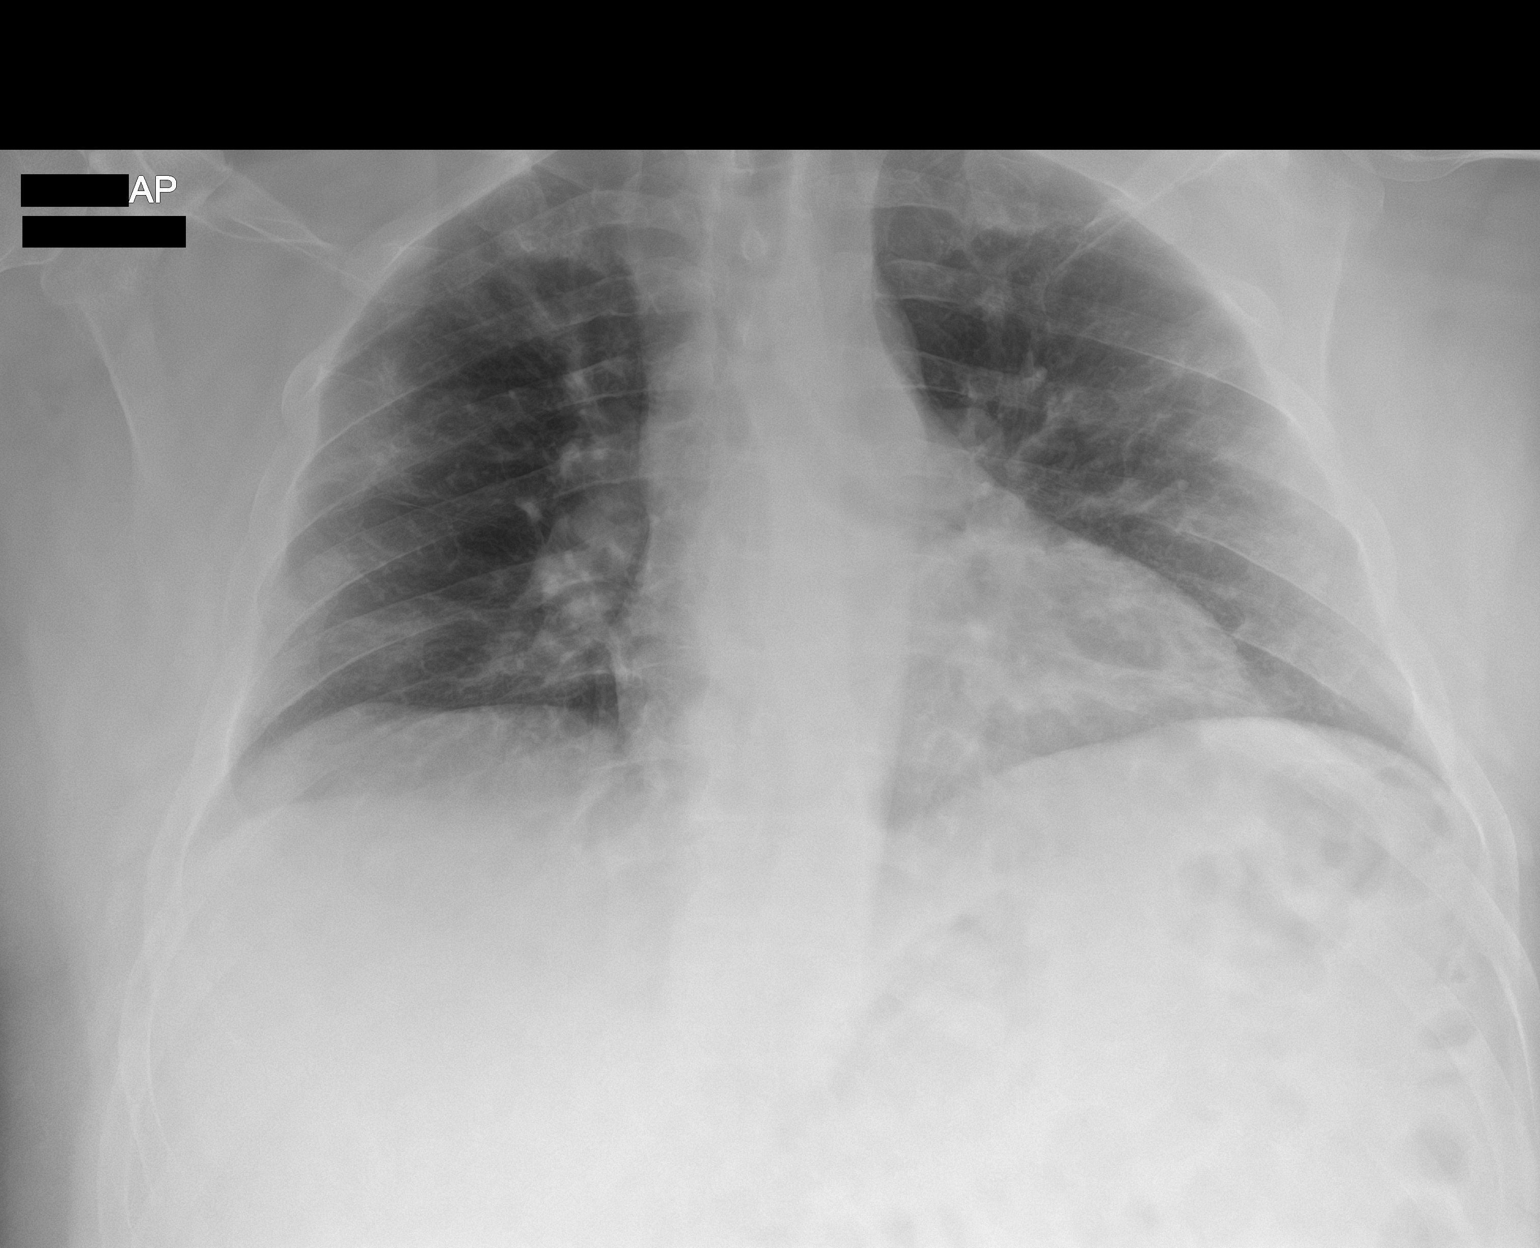

[1 of 1 positions shown; findings below may reference images not displayed]

FINDINGS: Heart size and mediastinal contours are within normal limits. Subtle
peripheral airspace opacities bilaterally. No pleural effusion or
pneumothorax is seen. Osseous structures about the chest are
unremarkable.
IMPRESSION: Suspected early multifocal/bilateral pneumonia.

## 2021-01-03 DIAGNOSIS — Z961 Presence of intraocular lens: Secondary | ICD-10-CM | POA: Diagnosis not present

## 2021-01-03 DIAGNOSIS — E118 Type 2 diabetes mellitus with unspecified complications: Secondary | ICD-10-CM | POA: Diagnosis not present

## 2021-01-03 DIAGNOSIS — H18413 Arcus senilis, bilateral: Secondary | ICD-10-CM | POA: Diagnosis not present

## 2021-01-03 DIAGNOSIS — B351 Tinea unguium: Secondary | ICD-10-CM | POA: Diagnosis not present

## 2021-01-03 DIAGNOSIS — Z1331 Encounter for screening for depression: Secondary | ICD-10-CM | POA: Diagnosis not present

## 2021-01-03 DIAGNOSIS — E119 Type 2 diabetes mellitus without complications: Secondary | ICD-10-CM | POA: Diagnosis not present

## 2021-01-03 DIAGNOSIS — E1165 Type 2 diabetes mellitus with hyperglycemia: Secondary | ICD-10-CM | POA: Diagnosis not present

## 2021-01-03 DIAGNOSIS — I10 Essential (primary) hypertension: Secondary | ICD-10-CM | POA: Diagnosis not present

## 2021-01-03 DIAGNOSIS — Z1389 Encounter for screening for other disorder: Secondary | ICD-10-CM | POA: Diagnosis not present

## 2021-01-03 DIAGNOSIS — Z6836 Body mass index (BMI) 36.0-36.9, adult: Secondary | ICD-10-CM | POA: Diagnosis not present

## 2021-01-03 DIAGNOSIS — E6609 Other obesity due to excess calories: Secondary | ICD-10-CM | POA: Diagnosis not present

## 2021-01-03 DIAGNOSIS — N400 Enlarged prostate without lower urinary tract symptoms: Secondary | ICD-10-CM | POA: Diagnosis not present

## 2021-01-03 DIAGNOSIS — H401131 Primary open-angle glaucoma, bilateral, mild stage: Secondary | ICD-10-CM | POA: Diagnosis not present

## 2021-01-03 DIAGNOSIS — I700 Atherosclerosis of aorta: Secondary | ICD-10-CM | POA: Diagnosis not present

## 2021-01-20 ENCOUNTER — Other Ambulatory Visit: Payer: Self-pay | Admitting: Student

## 2021-01-24 DIAGNOSIS — R7989 Other specified abnormal findings of blood chemistry: Secondary | ICD-10-CM | POA: Diagnosis not present

## 2021-01-28 DIAGNOSIS — H401131 Primary open-angle glaucoma, bilateral, mild stage: Secondary | ICD-10-CM | POA: Diagnosis not present

## 2021-01-28 DIAGNOSIS — H04123 Dry eye syndrome of bilateral lacrimal glands: Secondary | ICD-10-CM | POA: Diagnosis not present

## 2021-01-28 DIAGNOSIS — H43311 Vitreous membranes and strands, right eye: Secondary | ICD-10-CM | POA: Diagnosis not present

## 2021-01-28 DIAGNOSIS — H18413 Arcus senilis, bilateral: Secondary | ICD-10-CM | POA: Diagnosis not present

## 2021-04-03 DIAGNOSIS — E6609 Other obesity due to excess calories: Secondary | ICD-10-CM | POA: Diagnosis not present

## 2021-04-19 DIAGNOSIS — D72829 Elevated white blood cell count, unspecified: Secondary | ICD-10-CM | POA: Diagnosis not present

## 2021-05-10 DIAGNOSIS — E291 Testicular hypofunction: Secondary | ICD-10-CM | POA: Diagnosis not present

## 2021-06-07 DIAGNOSIS — E6609 Other obesity due to excess calories: Secondary | ICD-10-CM | POA: Diagnosis not present

## 2021-06-07 DIAGNOSIS — E119 Type 2 diabetes mellitus without complications: Secondary | ICD-10-CM | POA: Diagnosis not present

## 2021-06-07 DIAGNOSIS — Z8507 Personal history of malignant neoplasm of pancreas: Secondary | ICD-10-CM | POA: Diagnosis not present

## 2021-06-07 DIAGNOSIS — B09 Unspecified viral infection characterized by skin and mucous membrane lesions: Secondary | ICD-10-CM | POA: Diagnosis not present

## 2021-06-07 DIAGNOSIS — Z6835 Body mass index (BMI) 35.0-35.9, adult: Secondary | ICD-10-CM | POA: Diagnosis not present

## 2021-06-07 DIAGNOSIS — R509 Fever, unspecified: Secondary | ICD-10-CM | POA: Diagnosis not present

## 2021-06-07 DIAGNOSIS — K219 Gastro-esophageal reflux disease without esophagitis: Secondary | ICD-10-CM | POA: Diagnosis not present

## 2021-06-07 DIAGNOSIS — I10 Essential (primary) hypertension: Secondary | ICD-10-CM | POA: Diagnosis not present

## 2021-06-07 DIAGNOSIS — N400 Enlarged prostate without lower urinary tract symptoms: Secondary | ICD-10-CM | POA: Diagnosis not present

## 2021-06-07 DIAGNOSIS — I889 Nonspecific lymphadenitis, unspecified: Secondary | ICD-10-CM | POA: Diagnosis not present

## 2021-06-08 ENCOUNTER — Encounter (HOSPITAL_COMMUNITY): Payer: Self-pay | Admitting: Emergency Medicine

## 2021-06-08 ENCOUNTER — Other Ambulatory Visit: Payer: Self-pay

## 2021-06-08 ENCOUNTER — Emergency Department (HOSPITAL_COMMUNITY)
Admission: EM | Admit: 2021-06-08 | Discharge: 2021-06-09 | Disposition: A | Discharge status: Home / Self Care | Payer: Commercial Managed Care - PPO | Attending: Emergency Medicine | Admitting: Emergency Medicine

## 2021-06-08 DIAGNOSIS — R5381 Other malaise: Secondary | ICD-10-CM | POA: Insufficient documentation

## 2021-06-08 DIAGNOSIS — Z7984 Long term (current) use of oral hypoglycemic drugs: Secondary | ICD-10-CM | POA: Diagnosis not present

## 2021-06-08 DIAGNOSIS — Z794 Long term (current) use of insulin: Secondary | ICD-10-CM | POA: Diagnosis not present

## 2021-06-08 DIAGNOSIS — D72829 Elevated white blood cell count, unspecified: Secondary | ICD-10-CM

## 2021-06-08 DIAGNOSIS — Z8616 Personal history of COVID-19: Secondary | ICD-10-CM | POA: Insufficient documentation

## 2021-06-08 DIAGNOSIS — R6883 Chills (without fever): Secondary | ICD-10-CM | POA: Diagnosis not present

## 2021-06-08 DIAGNOSIS — Z20822 Contact with and (suspected) exposure to covid-19: Secondary | ICD-10-CM | POA: Diagnosis not present

## 2021-06-08 DIAGNOSIS — E119 Type 2 diabetes mellitus without complications: Secondary | ICD-10-CM | POA: Insufficient documentation

## 2021-06-08 DIAGNOSIS — F17200 Nicotine dependence, unspecified, uncomplicated: Secondary | ICD-10-CM | POA: Diagnosis not present

## 2021-06-08 DIAGNOSIS — Z79899 Other long term (current) drug therapy: Secondary | ICD-10-CM | POA: Insufficient documentation

## 2021-06-08 DIAGNOSIS — R21 Rash and other nonspecific skin eruption: Secondary | ICD-10-CM | POA: Diagnosis not present

## 2021-06-08 DIAGNOSIS — I10 Essential (primary) hypertension: Secondary | ICD-10-CM | POA: Insufficient documentation

## 2021-06-08 DIAGNOSIS — Z7982 Long term (current) use of aspirin: Secondary | ICD-10-CM | POA: Diagnosis not present

## 2021-06-08 LAB — CBC WITH DIFFERENTIAL/PLATELET
Abs Immature Granulocytes: 0.33 10*3/uL — ABNORMAL HIGH (ref 0.00–0.07)
Basophils Absolute: 0.2 10*3/uL — ABNORMAL HIGH (ref 0.0–0.1)
Basophils Relative: 1 %
Eosinophils Absolute: 0.7 10*3/uL — ABNORMAL HIGH (ref 0.0–0.5)
Eosinophils Relative: 4 %
HCT: 40.9 % (ref 39.0–52.0)
Hemoglobin: 13.4 g/dL (ref 13.0–17.0)
Immature Granulocytes: 2 %
Lymphocytes Relative: 12 %
Lymphs Abs: 2.4 10*3/uL (ref 0.7–4.0)
MCH: 28.2 pg (ref 26.0–34.0)
MCHC: 32.8 g/dL (ref 30.0–36.0)
MCV: 86.1 fL (ref 80.0–100.0)
Monocytes Absolute: 1.7 10*3/uL — ABNORMAL HIGH (ref 0.1–1.0)
Monocytes Relative: 9 %
Neutro Abs: 13.7 10*3/uL — ABNORMAL HIGH (ref 1.7–7.7)
Neutrophils Relative %: 72 %
Platelets: 351 10*3/uL (ref 150–400)
RBC: 4.75 MIL/uL (ref 4.22–5.81)
RDW: 13.3 % (ref 11.5–15.5)
WBC Morphology: ABNORMAL
WBC: 19.1 10*3/uL — ABNORMAL HIGH (ref 4.0–10.5)
nRBC: 0 % (ref 0.0–0.2)

## 2021-06-08 LAB — COMPREHENSIVE METABOLIC PANEL
ALT: 120 U/L — ABNORMAL HIGH (ref 0–44)
AST: 88 U/L — ABNORMAL HIGH (ref 15–41)
Albumin: 3.6 g/dL (ref 3.5–5.0)
Alkaline Phosphatase: 57 U/L (ref 38–126)
Anion gap: 14 (ref 5–15)
BUN: 21 mg/dL (ref 8–23)
CO2: 22 mmol/L (ref 22–32)
Calcium: 8.7 mg/dL — ABNORMAL LOW (ref 8.9–10.3)
Chloride: 98 mmol/L (ref 98–111)
Creatinine, Ser: 1.13 mg/dL (ref 0.61–1.24)
GFR, Estimated: >60 mL/min (ref >60)
Glucose, Bld: 166 mg/dL — ABNORMAL HIGH (ref 70–99)
Potassium: 3.3 mmol/L — ABNORMAL LOW (ref 3.5–5.1)
Sodium: 134 mmol/L — ABNORMAL LOW (ref 135–145)
Total Bilirubin: 0.8 mg/dL (ref 0.3–1.2)
Total Protein: 7.3 g/dL (ref 6.5–8.1)

## 2021-06-08 LAB — RESP PANEL BY RT-PCR (FLU A&B, COVID) ARPGX2
Influenza A by PCR: NEGATIVE
Influenza B by PCR: NEGATIVE
SARS Coronavirus 2 by RT PCR: NEGATIVE

## 2021-06-08 MED ORDER — HYDROXYZINE HCL 25 MG PO TABS
25.0000 mg | ORAL_TABLET | Freq: Once | ORAL | Status: AC
  Administered 2021-06-09: 25 mg via ORAL
  Filled 2021-06-08: qty 1

## 2021-06-08 MED ORDER — PREDNISONE 50 MG PO TABS
60.0000 mg | ORAL_TABLET | Freq: Once | ORAL | Status: AC
  Administered 2021-06-09: 60 mg via ORAL
  Filled 2021-06-08: qty 1

## 2021-06-08 MED ORDER — HYDROXYZINE HCL 25 MG PO TABS: 25.0000 mg | ORAL_TABLET | Freq: Four times a day (QID) | ORAL | 0 refills | Status: DC

## 2021-06-08 MED ORDER — PREDNISONE 10 MG (21) PO TBPK: ORAL_TABLET | Freq: Every day | ORAL | 0 refills | Status: DC

## 2021-06-08 NOTE — Discharge Instructions (Addendum)
Continue taking the doxycycline as prescribed   Please take the prednisone as directed as well  You may continue taking benadryl for your itching   Some of your laboratory tests and your covid test are pending at the time of discharge. You can follow up on the results on your mychart.  Please follow up with your primary care provider within 5-7 days for re-evaluation of your symptoms and to repeat your labs. If you do not have a primary care provider, information for a healthcare clinic has been provided for you to make arrangements for follow up care. Please return to the emergency department for any new or worsening symptoms.

## 2021-06-08 NOTE — ED Triage Notes (Signed)
Pt c/o diffused rash all over that itches. Pt seen by PCP yesterday and given abx with no improvement.

## 2021-06-08 NOTE — ED Provider Notes (Addendum)
[facility] EMERGENCY DEPARTMENT Provider Note   CSN: [id number] Arrival date & time: 06/08/21  2039     History Chief Complaint  Patient presents with   Rash    [name] is a 60 y.o. male.  HPI  60 year old male with a history of COVID, diabetes, GERD, upper lipidemia, hypertension, small bowel obstruction who presents to the emergency department today for evaluation of a rash.  States that the rash started 2 days ago.  It initially started on his back and spread throughout his whole body.  The rash is pruritic in nature.  He has had some associated chills and malaise.  He denies any vomiting, diarrhea, URI symptoms, cough.  He does report recent exposure to ticks but is not sure if he has had any bites.  He was seen by his PCP a few days ago and had laboratory work drawn including RMSF, Lyme titers.  He is not aware of the results yet but was started on doxycycline.   Past Medical History:  Diagnosis Date   COVID-19    Diabetes mellitus without complication ([place])    foot bone spur    GERD (gastroesophageal reflux disease)    Hyperlipidemia    Hypertension    Small bowel obstruction [facility])     Patient Active Problem List   Diagnosis Date Noted   Sebaceous cyst 01/20/2019   Special screening for malignant neoplasms, colon    Mass of pancreas    Pancreatic mass 02/25/2016   Ileus ([place]) 02/25/2016   Nausea with vomiting 02/25/2016   Dehydration 02/25/2016    Past Surgical History:  Procedure Laterality Date   COLONOSCOPY  2007   [facility]- sigmoid colon polyp and TI lymphoid hypoplasia but o/w no evidence of crohn's disease, no bx report available.    COLONOSCOPY N/A 08/31/2017   Procedure: COLONOSCOPY;  Surgeon: Danie Binder, MD;  Location: [facility];  Service: Endoscopy;  Laterality: N/A;  [place] CAPSULE STUDY  10/12/2006   there was no evidence of active GI bleeding, inflammation or erosions on this study   HERNIA REPAIR      as a child   Pancreatic cancer surgery     TONSILLECTOMY         Family History  Problem Relation Age of Onset   Stroke Mother    Hypertension Mother    Diabetes Mother    CAD Mother    Colon cancer Neg Hx    Colon polyps Neg Hx     Social History   Tobacco Use   Smoking status: Never   Smokeless tobacco: Current    Types: Snuff  Vaping Use   Vaping Use: Never used  Substance Use Topics   Alcohol use: Not Currently    Alcohol/week: 1.0 standard drink    Types: 1 Cans of beer per week   Drug use: No    Home Medications Prior to Admission medications   Medication Sig Start Date End Date Taking? Authorizing Provider  hydrOXYzine (ATARAX/VISTARIL) 25 MG tablet Take 1 tablet (25 mg total) by mouth every 6 (six) hours. 06/08/21  Yes Ardith Test S, PA-C  predniSONE (STERAPRED UNI-PAK 21 TAB) 10 MG (21) TBPK tablet Take by mouth daily. Take 6 tabs by mouth daily  for 2 days, then 5 tabs for 2 days, then 4 tabs for 2 days, then 3 tabs for 2 days, 2 tabs for 2 days, then 1 tab  by mouth daily for 2 days 06/08/21  Yes Costella Schwarz S, PA-C  amLODipine-valsartan (EXFORGE) 10-320 MG tablet TAKE 1 TABLET BY MOUTH DAILY 12/17/20   Ahmed Prima, Fransisco Hertz, PA-C  aspirin EC 81 MG tablet Take 81 mg by mouth daily.    [provider]  furosemide (LASIX) 20 MG tablet Take 1 tablet by mouth daily. 07/31/17   [provider]  hydrochlorothiazide (HYDRODIURIL) 25 MG tablet Take 25 mg by mouth daily.    [provider]  insulin glargine (LANTUS) 100 UNIT/ML injection Inject 76 Units into the skin every morning.     [provider]  lansoprazole (PREVACID) 15 MG capsule Take 15 mg by mouth daily.     [provider]  lisinopril (ZESTRIL) 5 MG tablet Take 5 mg by mouth daily. 11/09/19   [provider]  metFORMIN (GLUCOPHAGE) 500 MG tablet Take 500-1,000 mg by mouth See admin instructions. (2500 mg daily) 1000 mg  am  500 mg lunch 1000 mg pm     [provider]  Multiple Vitamins-Minerals (MULTIVITAMIN WITH MINERALS) tablet Take 1 tablet by mouth daily.    [provider]  oxyCODONE (ROXICODONE) 5 MG immediate release tablet Take 1 tablet (5 mg total) by mouth every 4 (four) hours as needed for severe pain or breakthrough pain. 08/16/20   Virl Cagey, MD  simvastatin (ZOCOR) 40 MG tablet  01/05/19   [provider]    Allergies    Cortisone and Penicillins  Review of Systems   Review of Systems  Constitutional:  Positive for chills. Negative for fever.  HENT:  Negative for ear pain and sore throat.   Eyes:  Negative for pain and visual disturbance.  Respiratory:  Negative for cough and shortness of breath.   Cardiovascular:  Negative for chest pain.  Gastrointestinal:  Negative for abdominal pain, constipation, diarrhea, nausea and vomiting.  Genitourinary:  Negative for dysuria and hematuria.  Musculoskeletal:  Negative for back pain.  Skin:  Positive for rash.  Neurological:  Negative for headaches.  All other systems reviewed and are negative.  Physical Exam Updated Vital Signs BP (!) 167/100 (BP Location: Right Arm)   Pulse (!) 104   Temp 98.6 F (37 C) (Oral)   Resp 20   Ht _0  (1.803 m)   Wt 115.2 kg   SpO2 98%   BMI 35.42 kg/m   Physical Exam Vitals and nursing note reviewed.  Constitutional:      Appearance: He is well-developed.  HENT:     Head: Normocephalic and atraumatic.  Eyes:     Conjunctiva/sclera: Conjunctivae normal.  Cardiovascular:     Rate and Rhythm: Normal rate and regular rhythm.  Pulmonary:     Effort: Pulmonary effort is normal.     Breath sounds: Normal breath sounds.  Abdominal:     Palpations: Abdomen is soft.     Tenderness: There is no abdominal tenderness.  Musculoskeletal:     Cervical back: Neck supple.  Skin:    General: Skin is warm and dry.     Findings: Rash present.     Comments: Diffuse erythematous patchy rash noted to the  torso, arms and legs. There is some involvement of the rash on the right hand.   Neurological:     Mental Status: He is alert.        ED Results / Procedures / Treatments   Labs (all labs ordered are listed, but only abnormal results are displayed) Labs  Reviewed  CBC WITH DIFFERENTIAL/PLATELET - Abnormal; Notable for the following components:      Result Value   WBC 19.1 (*)    Neutro Abs 13.7 (*)    Monocytes Absolute 1.7 (*)    Eosinophils Absolute 0.7 (*)    Basophils Absolute 0.2 (*)    Abs Immature Granulocytes 0.33 (*)    All other components within normal limits  COMPREHENSIVE METABOLIC PANEL - Abnormal; Notable for the following components:   Sodium 134 (*)    Potassium 3.3 (*)    Glucose, Bld 166 (*)    Calcium 8.7 (*)    AST 88 (*)    ALT 120 (*)    All other components within normal limits  RESP PANEL BY RT-PCR (FLU A&B, COVID) ARPGX2  RPR  HIV ANTIBODY (ROUTINE TESTING W REFLEX)    EKG None  Radiology No results found.  Procedures Procedures   Medications Ordered in ED Medications  predniSONE (DELTASONE) tablet 60 mg (has no administration in time range)  hydrOXYzine (ATARAX/VISTARIL) tablet 25 mg (has no administration in time range)    ED Course  I have reviewed the triage vital signs and the nursing notes.  Pertinent labs & imaging results that were available during my care of the patient were reviewed by me and considered in my medical decision making (see chart for details).    MDM Rules/Calculators/A&P                          60 year old male presents to the emergency department today for evaluation of a rash that started 2 days ago.  He is also had some low-grade fevers at home.  Denies any other systemic complaints.  On my assessment, I rechecked the patient's temperature which was 100.70F  Will obtain laboratory work, COVID test.  CBC with leukocytosis of 19,000, there are also abnormal lymphocytes and blast cells noted. CMP shows  mild hyponatremia, mild hypokalemia.  He also has mildly elevated LFTs and mildly elevated bilirubin.  Discussed case with Dr. Earlie Server who states that leukemia's are typically treated at [place] contacting [facility]. States that if [facility] cannot admit, then patient could likely be admitted to [facility] and have bone marrow biopsy on Monday.  Dr Karle Starch discussed with Dr Linus Orn at [facility] who does not feel that pts CBC is reflective of an acute leukemia but rather a leukemoid reaction. Recommends rechecked CBC later this week.   Karle Starch personally evaluated the patient and advised the patient on plan to DC with steroids and have close follow-up with PCP for repeat labs.  Have advised on strict return precautions   Final Clinical Impression(s) / ED Diagnoses Final diagnoses:  Rash  Leukocytosis, unspecified type    Rx / DC Orders ED Discharge Orders          Ordered    hydrOXYzine (ATARAX/VISTARIL) 25 MG tablet  Every 6 hours        06/08/21 2355    predniSONE (STERAPRED UNI-PAK 21 TAB) 10 MG (21) TBPK tablet  Daily        06/08/21 2355             Toren Tucholski S, PA-C 06/08/21 2348    Estill Llerena S, PA-C 06/09/21 0000    Truddie Hidden, MD 06/09/21 1109

## 2021-06-09 DIAGNOSIS — R21 Rash and other nonspecific skin eruption: Secondary | ICD-10-CM | POA: Diagnosis not present

## 2021-06-09 LAB — HIV ANTIBODY (ROUTINE TESTING W REFLEX): HIV Screen 4th Generation wRfx: NONREACTIVE

## 2021-06-10 DIAGNOSIS — I129 Hypertensive chronic kidney disease with stage 1 through stage 4 chronic kidney disease, or unspecified chronic kidney disease: Secondary | ICD-10-CM | POA: Diagnosis not present

## 2021-06-10 DIAGNOSIS — N179 Acute kidney failure, unspecified: Secondary | ICD-10-CM | POA: Diagnosis not present

## 2021-06-10 DIAGNOSIS — R7989 Other specified abnormal findings of blood chemistry: Secondary | ICD-10-CM | POA: Diagnosis not present

## 2021-06-10 DIAGNOSIS — E1122 Type 2 diabetes mellitus with diabetic chronic kidney disease: Secondary | ICD-10-CM | POA: Diagnosis not present

## 2021-06-10 DIAGNOSIS — N182 Chronic kidney disease, stage 2 (mild): Secondary | ICD-10-CM | POA: Diagnosis not present

## 2021-06-10 DIAGNOSIS — E291 Testicular hypofunction: Secondary | ICD-10-CM | POA: Diagnosis not present

## 2021-06-10 LAB — RPR: RPR Ser Ql: NONREACTIVE

## 2021-06-11 DIAGNOSIS — N179 Acute kidney failure, unspecified: Secondary | ICD-10-CM | POA: Diagnosis not present

## 2021-06-13 ENCOUNTER — Other Ambulatory Visit (HOSPITAL_COMMUNITY): Payer: Self-pay | Admitting: Nephrology

## 2021-06-13 DIAGNOSIS — N189 Chronic kidney disease, unspecified: Secondary | ICD-10-CM

## 2021-06-20 ENCOUNTER — Ambulatory Visit (HOSPITAL_COMMUNITY)
Admission: RE | Admit: 2021-06-20 | Discharge: 2021-06-20 | Disposition: A | Discharge status: Home / Self Care | Payer: Commercial Managed Care - PPO | Source: Ambulatory Visit | Attending: Nephrology | Admitting: Nephrology

## 2021-06-20 ENCOUNTER — Other Ambulatory Visit: Payer: Self-pay

## 2021-06-20 DIAGNOSIS — N189 Chronic kidney disease, unspecified: Secondary | ICD-10-CM | POA: Diagnosis not present

## 2021-06-20 DIAGNOSIS — K7689 Other specified diseases of liver: Secondary | ICD-10-CM | POA: Diagnosis not present

## 2021-06-20 DIAGNOSIS — N289 Disorder of kidney and ureter, unspecified: Secondary | ICD-10-CM | POA: Diagnosis not present

## 2021-06-20 DIAGNOSIS — N179 Acute kidney failure, unspecified: Secondary | ICD-10-CM | POA: Insufficient documentation

## 2021-06-24 DIAGNOSIS — D72829 Elevated white blood cell count, unspecified: Secondary | ICD-10-CM | POA: Diagnosis not present

## 2021-07-30 DIAGNOSIS — E291 Testicular hypofunction: Secondary | ICD-10-CM | POA: Diagnosis not present

## 2021-09-30 DIAGNOSIS — C7A8 Other malignant neuroendocrine tumors: Secondary | ICD-10-CM | POA: Diagnosis not present

## 2021-09-30 DIAGNOSIS — Z9889 Other specified postprocedural states: Secondary | ICD-10-CM | POA: Diagnosis not present

## 2021-09-30 DIAGNOSIS — R590 Localized enlarged lymph nodes: Secondary | ICD-10-CM | POA: Diagnosis not present

## 2021-09-30 DIAGNOSIS — Z90411 Acquired partial absence of pancreas: Secondary | ICD-10-CM | POA: Diagnosis not present

## 2021-12-05 DIAGNOSIS — E291 Testicular hypofunction: Secondary | ICD-10-CM | POA: Diagnosis not present

## 2021-12-31 DIAGNOSIS — Z1331 Encounter for screening for depression: Secondary | ICD-10-CM | POA: Diagnosis not present

## 2021-12-31 DIAGNOSIS — K219 Gastro-esophageal reflux disease without esophagitis: Secondary | ICD-10-CM | POA: Diagnosis not present

## 2021-12-31 DIAGNOSIS — E782 Mixed hyperlipidemia: Secondary | ICD-10-CM | POA: Diagnosis not present

## 2021-12-31 DIAGNOSIS — I10 Essential (primary) hypertension: Secondary | ICD-10-CM | POA: Diagnosis not present

## 2021-12-31 DIAGNOSIS — E291 Testicular hypofunction: Secondary | ICD-10-CM | POA: Diagnosis not present

## 2021-12-31 DIAGNOSIS — E669 Obesity, unspecified: Secondary | ICD-10-CM | POA: Diagnosis not present

## 2021-12-31 DIAGNOSIS — Z6836 Body mass index (BMI) 36.0-36.9, adult: Secondary | ICD-10-CM | POA: Diagnosis not present

## 2021-12-31 DIAGNOSIS — J329 Chronic sinusitis, unspecified: Secondary | ICD-10-CM | POA: Diagnosis not present

## 2022-02-11 DIAGNOSIS — H18413 Arcus senilis, bilateral: Secondary | ICD-10-CM | POA: Diagnosis not present

## 2022-02-11 DIAGNOSIS — H43311 Vitreous membranes and strands, right eye: Secondary | ICD-10-CM | POA: Diagnosis not present

## 2022-02-11 DIAGNOSIS — H401131 Primary open-angle glaucoma, bilateral, mild stage: Secondary | ICD-10-CM | POA: Diagnosis not present

## 2022-02-11 DIAGNOSIS — H04123 Dry eye syndrome of bilateral lacrimal glands: Secondary | ICD-10-CM | POA: Diagnosis not present

## 2022-03-03 DIAGNOSIS — E291 Testicular hypofunction: Secondary | ICD-10-CM | POA: Diagnosis not present

## 2022-04-02 DIAGNOSIS — E291 Testicular hypofunction: Secondary | ICD-10-CM | POA: Diagnosis not present

## 2022-05-05 DIAGNOSIS — M5416 Radiculopathy, lumbar region: Secondary | ICD-10-CM | POA: Diagnosis not present

## 2022-05-05 DIAGNOSIS — E6609 Other obesity due to excess calories: Secondary | ICD-10-CM | POA: Diagnosis not present

## 2022-05-05 DIAGNOSIS — E291 Testicular hypofunction: Secondary | ICD-10-CM | POA: Diagnosis not present

## 2022-05-05 DIAGNOSIS — E119 Type 2 diabetes mellitus without complications: Secondary | ICD-10-CM | POA: Diagnosis not present

## 2022-05-05 DIAGNOSIS — Z0001 Encounter for general adult medical examination with abnormal findings: Secondary | ICD-10-CM | POA: Diagnosis not present

## 2022-05-05 DIAGNOSIS — Z6836 Body mass index (BMI) 36.0-36.9, adult: Secondary | ICD-10-CM | POA: Diagnosis not present

## 2022-05-05 DIAGNOSIS — I700 Atherosclerosis of aorta: Secondary | ICD-10-CM | POA: Diagnosis not present

## 2022-05-05 DIAGNOSIS — E1165 Type 2 diabetes mellitus with hyperglycemia: Secondary | ICD-10-CM | POA: Diagnosis not present

## 2022-06-13 DIAGNOSIS — E291 Testicular hypofunction: Secondary | ICD-10-CM | POA: Diagnosis not present

## 2022-06-27 DIAGNOSIS — E291 Testicular hypofunction: Secondary | ICD-10-CM | POA: Diagnosis not present

## 2022-07-11 DIAGNOSIS — E291 Testicular hypofunction: Secondary | ICD-10-CM | POA: Diagnosis not present

## 2022-07-28 DIAGNOSIS — E291 Testicular hypofunction: Secondary | ICD-10-CM | POA: Diagnosis not present

## 2022-08-26 DIAGNOSIS — E291 Testicular hypofunction: Secondary | ICD-10-CM | POA: Diagnosis not present

## 2022-09-11 DIAGNOSIS — E291 Testicular hypofunction: Secondary | ICD-10-CM | POA: Diagnosis not present

## 2022-09-16 NOTE — Progress Notes (Signed)
CARDIOLOGY CONSULT NOTE       Patient ID: KEYONDRE HEPBURN MRN: [id number] DOB/AGE: [birth year] 62 y.o.  Admit date: (Not on file) Referring Physician: Gerarda Fraction Primary Physician: Redmond School, MD Primary Cardiologist: New Reason for Consultation: Palpitations  Active Problems:   * No active hospital problems. *   HPI:  61 y.o. referred by Dr Gerarda Fraction for palpitations History of HTN, HLD , DM-2 and GERD. Last seen by me in 2019 and PA in 2020 for palpitations He had a normal myovue June 2019 with EF 70%  Monitor 01/31/19 with no significant arrhythmia In past his ECG has showed LVH with strain and T wave inversions in 3,F   ECG in office today SR PAC nonspecific ST changes rate 98   He works Armed forces technical officer a [place] Last few weeks more heart fluttering Has 2 diet cokes a day and a couple of beers/month. No other changes Some exertional dyspnea and fatigue No syncope   Taking care of his dad since October some added stress His dad had an MI  ROS All other systems reviewed and negative except as noted above  Past Medical History:  Diagnosis Date   COVID-19    Diabetes mellitus without complication ([place])    foot bone spur    GERD (gastroesophageal reflux disease)    Hyperlipidemia    Hypertension    Small bowel obstruction ([place])     Family History  Problem Relation Age of Onset   Stroke Mother    Hypertension Mother    Diabetes Mother    CAD Mother    Colon cancer Neg Hx    Colon polyps Neg Hx     Social History   Socioeconomic History   Marital status: Married    Spouse name: Not on file   Number of children: Not on file   Years of education: Not on file   Highest education level: Not on file  Occupational History   Not on file  Tobacco Use   Smoking status: Never   Smokeless tobacco: Current    Types: Snuff  Vaping Use   Vaping Use: Never used  Substance and Sexual Activity   Alcohol use: Not Currently    Alcohol/week: 1.0 standard drink of  alcohol    Types: 1 Cans of beer per week   Drug use: No   Sexual activity: Yes  Other Topics Concern   Not on file  Social History Narrative   Not on file   Social Determinants of Health   Financial Resource Strain: Not on file  Food Insecurity: Not on file  Transportation Needs: Not on file  Physical Activity: Not on file  Stress: Not on file  Social Connections: Not on file  Intimate Partner Violence: Not on file    Past Surgical History:  Procedure Laterality Date   COLONOSCOPY  2007   [place]- sigmoid colon polyp and TI lymphoid hypoplasia but o/w no evidence of crohn's disease, no bx report available.    COLONOSCOPY N/A 08/31/2017   Procedure: COLONOSCOPY;  Surgeon: Danie Binder, MD;  [facility];  Service: Endoscopy;  Laterality: N/A;  [place] STUDY  10/12/2006   there was no evidence of active GI bleeding, inflammation or erosions on this study   [place]     as a child   Pancreatic cancer surgery     TONSILLECTOMY  Current Outpatient Medications:    ALPRAZolam (XANAX) 0.5 MG tablet, Take 0.5 mg by mouth 3 (three) times daily as needed., Disp: , Rfl:    amLODipine-valsartan (EXFORGE) 10-320 MG tablet, TAKE 1 TABLET BY MOUTH DAILY, Disp: 7 tablet, Rfl: 0   aspirin EC 81 MG tablet, Take 81 mg by mouth daily., Disp: , Rfl:    Cholecalciferol (D3 5000 PO), Take by mouth., Disp: , Rfl:    furosemide (LASIX) 20 MG tablet, Take 1 tablet by mouth daily. TAKES M-W-F, Disp: , Rfl: 4   hydrochlorothiazide (HYDRODIURIL) 25 MG tablet, Take 25 mg by mouth daily., Disp: , Rfl:    insulin glargine (LANTUS) 100 UNIT/ML injection, Inject 76 Units into the skin every morning. , Disp: , Rfl:    lansoprazole (PREVACID) 15 MG capsule, Take 15 mg by mouth daily. , Disp: , Rfl:    lisinopril (ZESTRIL) 5 MG tablet, Take 5 mg by mouth daily., Disp: , Rfl:    loratadine (CLARITIN) 10 MG tablet, Take 10 mg by mouth daily., Disp: ,  Rfl:    metFORMIN (GLUCOPHAGE) 500 MG tablet, Take 500-1,000 mg by mouth See admin instructions. (2500 mg daily) 1000 mg  am  500 mg lunch 1000 mg pm, Disp: , Rfl:    Multiple Vitamins-Minerals (MULTIVITAMIN WITH MINERALS) tablet, Take 1 tablet by mouth daily., Disp: , Rfl:    oxyCODONE (ROXICODONE) 5 MG immediate release tablet, Take 1 tablet (5 mg total) by mouth every 4 (four) hours as needed for severe pain or breakthrough pain., Disp: 5 tablet, Rfl: 0   simvastatin (ZOCOR) 40 MG tablet, , Disp: , Rfl:    vitamin k 100 MCG tablet, Take 100 mcg by mouth daily., Disp: , Rfl:     Physical Exam: Blood pressure (!) 152/92, pulse 98, height '5\' 11"'$  (1.803 m), weight 253 lb (114.8 kg), SpO2 96 %.   Affect appropriate Healthy:  appears stated age 39: normal Neck supple with no adenopathy JVP normal no bruits no thyromegaly Lungs clear with no wheezing and good diaphragmatic motion Heart:  S1/S2 no murmur, no rub, gallop or click PMI normal Abdomen: benighn, BS positve, no tenderness, no AAA no bruit.  No HSM or HJR Distal pulses intact with no bruits No edema Neuro non-focal Skin warm and dry No muscular weakness   Labs:   Lab Results  Component Value Date   WBC 19.1 (H) 06/08/2021   HGB 13.4 06/08/2021   HCT 40.9 06/08/2021   MCV 86.1 06/08/2021   PLT 351 06/08/2021   No results for input(s): "NA", "K", "CL", "CO2", "BUN", "CREATININE", "CALCIUM", "PROT", "BILITOT", "ALKPHOS", "ALT", "AST", "GLUCOSE" in the last 168 hours.  Invalid input(s): "LABALBU" No results found for: "CKTOTAL", "CKMB", "CKMBINDEX", "TROPONINI" No results found for: "CHOL" No results found for: "HDL" No results found for: "Calcium" Lab Results  Component Value Date   TRIG 210 (H) 12/04/2019   No results found for: "CHOLHDL" No results found for: "LDLDIRECT"    Radiology: No results found.  EKG: SR PAC nonspecific ST changes    ASSESSMENT AND PLAN:   Palpitations: with negative w/u in  past PAC on ECG start Toprol 50 mg daily no need for monitor currently  HTN:  see above elevated and elevated on home readings Toprol 50 mg  HLD:  On Zocor labs with primary DM:  Discussed low carb diet.  Target hemoglobin A1c is 6.5 or less.  Continue current medications. Dyspnea:  normal exam ? Anginal equivalent Improve BP  control Ex myovue    Toprol 50 mg  Ex Myovue F/U in [place] 6-8 weeks    Signed: Jenkins Rouge 09/25/2022, 10:58 AM

## 2022-09-25 ENCOUNTER — Ambulatory Visit: Payer: Commercial Managed Care - PPO | Attending: Cardiovascular Disease | Admitting: Cardiovascular Disease

## 2022-09-25 ENCOUNTER — Encounter: Payer: Self-pay | Admitting: Cardiovascular Disease

## 2022-09-25 VITALS — BP 152/92 | HR 98 | Ht 71.0 in | Wt 253.0 lb

## 2022-09-25 DIAGNOSIS — E785 Hyperlipidemia, unspecified: Secondary | ICD-10-CM | POA: Diagnosis not present

## 2022-09-25 DIAGNOSIS — I10 Essential (primary) hypertension: Secondary | ICD-10-CM | POA: Diagnosis not present

## 2022-09-25 DIAGNOSIS — R002 Palpitations: Secondary | ICD-10-CM

## 2022-09-25 MED ORDER — METOPROLOL SUCCINATE ER 50 MG PO TB24: 50.0000 mg | ORAL_TABLET | Freq: Every day | ORAL | 3 refills | Status: DC

## 2022-09-25 NOTE — Patient Instructions (Addendum)
Medication Instructions:  Your physician has recommended you make the following change in your medication:  1-START Metoprolol 50 mg by mouth daily  *If you need a refill on your cardiac medications before your next appointment, please call your pharmacy*  Lab Work: If you have labs (blood work) drawn today and your tests are completely normal, you will receive your results only by: [place] (if you have MyChart) OR A paper copy in the mail If you have any lab test that is abnormal or we need to change your treatment, we will call you to review the results.  Testing/Procedures: Your physician has requested that you have en exercise stress myoview. For further information please visit [email or web address]. Please follow instruction sheet, as given.   Follow-Up: At [facility], you and your health needs are our priority.  As part of our continuing mission to provide you with exceptional heart care, we have created designated Provider Care Teams.  These Care Teams include your primary Cardiologist (physician) and Advanced Practice Providers (APPs -  Physician Assistants and Nurse Practitioners) who all work together to provide you with the care you need, when you need it.  We recommend signing up for the patient portal called "MyChart".  Sign up information is provided on this After Visit Summary.  MyChart is used to connect with patients for Virtual Visits (Telemedicine).  Patients are able to view lab/test results, encounter notes, upcoming appointments, etc.  Non-urgent messages can be sent to your provider as well.   To learn more about what you can do with MyChart, go to [email or web address].    Your next appointment:   6 week(s)  The format for your next appointment:   In Person  Provider:   Nicholes Rough, PA-C, Melina Copa, PA-C, Ambrose Pancoast, NP, Cecilie Kicks, NP, Ermalinda Barrios, PA-C, Christen Bame, NP, or Richardson Dopp, PA-C     Then, Jenkins Rouge, MD will plan to  see you again in 6 month(s).     Important Information About Sugar

## 2022-09-26 ENCOUNTER — Other Ambulatory Visit: Payer: Self-pay

## 2022-09-26 DIAGNOSIS — R072 Precordial pain: Secondary | ICD-10-CM

## 2022-09-26 DIAGNOSIS — E785 Hyperlipidemia, unspecified: Secondary | ICD-10-CM

## 2022-09-26 DIAGNOSIS — I10 Essential (primary) hypertension: Secondary | ICD-10-CM

## 2022-09-26 DIAGNOSIS — R002 Palpitations: Secondary | ICD-10-CM

## 2022-09-26 NOTE — Progress Notes (Signed)
Placed order for stress test at [facility].

## 2022-09-29 ENCOUNTER — Ambulatory Visit (HOSPITAL_COMMUNITY)
Admission: RE | Admit: 2022-09-29 | Discharge: 2022-09-29 | Disposition: A | Discharge status: Home / Self Care | Payer: Commercial Managed Care - PPO | Source: Ambulatory Visit | Attending: Cardiovascular Disease | Admitting: Cardiovascular Disease

## 2022-09-29 ENCOUNTER — Encounter (HOSPITAL_COMMUNITY): Payer: Self-pay

## 2022-09-29 DIAGNOSIS — R002 Palpitations: Secondary | ICD-10-CM | POA: Insufficient documentation

## 2022-09-29 DIAGNOSIS — E785 Hyperlipidemia, unspecified: Secondary | ICD-10-CM | POA: Insufficient documentation

## 2022-09-29 DIAGNOSIS — I10 Essential (primary) hypertension: Secondary | ICD-10-CM | POA: Insufficient documentation

## 2022-09-29 DIAGNOSIS — R072 Precordial pain: Secondary | ICD-10-CM | POA: Diagnosis not present

## 2022-09-29 HISTORY — DX: Malignant (primary) neoplasm, unspecified: C80.1

## 2022-09-29 LAB — NM MYOCAR MULTI W/SPECT W/WALL MOTION / EF
Angina Index: 0
Estimated workload: 9
Exercise duration (min): 7 min
Exercise duration (sec): 48 s
LV dias vol: 91 mL (ref 62–150)
LV sys vol: 31 mL
MPHR: 158 {beats}/min
Nuc Stress EF: 66 %
Peak HR: 137 {beats}/min
Percent HR: 86 %
RATE: 0.3
RPE: 15
Rest HR: 75 {beats}/min
Rest Nuclear Isotope Dose: 10.5 mCi
SDS: 1
SRS: 0
SSS: 1
Stress Nuclear Isotope Dose: 33 mCi
TID: 1.02

## 2022-09-29 MED ORDER — SODIUM CHLORIDE FLUSH 0.9 % IV SOLN
INTRAVENOUS | Status: AC
  Filled 2022-09-29: qty 10

## 2022-09-29 MED ORDER — TECHNETIUM TC 99M TETROFOSMIN IV KIT
30.0000 | PACK | Freq: Once | INTRAVENOUS | Status: AC | PRN
  Administered 2022-09-29: 33 via INTRAVENOUS

## 2022-09-29 MED ORDER — TECHNETIUM TC 99M TETROFOSMIN IV KIT
10.0000 | PACK | Freq: Once | INTRAVENOUS | Status: AC | PRN
  Administered 2022-09-29: 10.5 via INTRAVENOUS

## 2022-09-29 MED ORDER — REGADENOSON 0.4 MG/5ML IV SOLN
INTRAVENOUS | Status: AC
  Filled 2022-09-29: qty 5

## 2022-10-13 DIAGNOSIS — J069 Acute upper respiratory infection, unspecified: Secondary | ICD-10-CM | POA: Diagnosis not present

## 2022-10-13 DIAGNOSIS — Z6836 Body mass index (BMI) 36.0-36.9, adult: Secondary | ICD-10-CM | POA: Diagnosis not present

## 2022-10-13 DIAGNOSIS — E6609 Other obesity due to excess calories: Secondary | ICD-10-CM | POA: Diagnosis not present

## 2022-11-06 NOTE — Progress Notes (Signed)
Office Visit    Patient Name: [name] Date of Encounter: 11/07/2022  Primary Care Provider:  Redmond School, MD Primary Cardiologist:  Jenkins Rouge, MD Primary Electrophysiologist: None  Chief Complaint    [name] is a 61 y.o. male with PMH of HTN, HLD, DM type II, GERD who presents today for 6-week follow-up.  Past Medical History    Past Medical History:  Diagnosis Date   Cancer ([place])    pancreatic   COVID-19    Diabetes mellitus without complication ([place])    foot bone spur    GERD (gastroesophageal reflux disease)    Hyperlipidemia    Hypertension    Small bowel obstruction ([place])    Past Surgical History:  Procedure Laterality Date   COLONOSCOPY  2007   [facility] [facility]- sigmoid colon polyp and TI lymphoid hypoplasia but o/w no evidence of crohn's disease, no bx report available.    COLONOSCOPY N/A 08/31/2017   Procedure: COLONOSCOPY;  Surgeon: Danie Binder, MD;  Location[facility];  Service: Endoscopy;  Laterality: N/A;  [place] STUDY  10/12/2006   there was no evidence of active GI bleeding, inflammation or erosions on this study   [place]     as a child   Pancreatic cancer surgery     TONSILLECTOMY      Allergies  Allergies  Allergen Reactions   Cortisone Other (See Comments)    Hypes pt up - unable to sleep for days at a time   Penicillins Rash    Has patient had a PCN reaction causing immediate rash, facial/tongue/throat swelling, SOB or lightheadedness with hypotension: Yes Has patient had a PCN reaction causing severe rash involving mucus membranes or skin necrosis: No Has patient had a PCN reaction that required hospitalization No Has patient had a PCN reaction occurring within the last 10 years: No If all of the above answers are "NO", then may proceed with Cephalosporin use.     History of Present Illness    [name]  is a 61 year old male with the above mention past medical  history who presents today for 6-week follow-up.  He was initially seen by Dr. Johnsie Cancel in 2019 and had normal Myoview completed with event monitor for palpitations that showed no significant arrhythmia.  He reported an increase in heart flutters over also some exertional dyspnea and fatigue.  He was started on Toprol 50 mg daily and exercise Myoview was ordered.  Procedure results showed normal LV perfusion with EF of 66% and no significant perfusion defects or ischemia noted.  He had a Duke treadmill score of 5.  [name] presents today for follow-up of recent stress test with his wife.  Since last being seen in the office patient reports that he is doing much better and has noticed a decrease in his palpitations.  He notes that he has sciatica and does not have fatigue or dyspnea with exertion.  He reports full compliance with his medication and denies any side effects or adverse reactions.  His blood pressure today is well-controlled at 128/78 and heart rate is 88 bpm.  During our visit we reviewed the results of his Myoview stress test and patient had all questions answered to his satisfaction.  We further discussed primary prevention measures for decreasing progression of cardiovascular disease.  Patient denies chest pain, palpitations, dyspnea, PND, orthopnea, nausea, vomiting, dizziness, syncope, edema, weight  gain, or early satiety.  Home Medications    Current Outpatient Medications  Medication Sig Dispense Refill   ALPRAZolam (XANAX) 0.5 MG tablet Take 0.5 mg by mouth 3 (three) times daily as needed.     amLODipine-valsartan (EXFORGE) 10-320 MG tablet TAKE 1 TABLET BY MOUTH DAILY 7 tablet 0   aspirin EC 81 MG tablet Take 81 mg by mouth daily.     Cholecalciferol (D3 5000 PO) Take by mouth.     furosemide (LASIX) 20 MG tablet Take 1 tablet by mouth daily. TAKES M-W-F  4   hydrochlorothiazide (HYDRODIURIL) 25 MG tablet Take 25 mg by mouth daily.     insulin glargine (LANTUS) 100 UNIT/ML  injection Inject 76 Units into the skin every morning.      lansoprazole (PREVACID) 15 MG capsule Take 15 mg by mouth daily.      lisinopril (ZESTRIL) 5 MG tablet Take 5 mg by mouth daily.     loratadine (CLARITIN) 10 MG tablet Take 10 mg by mouth daily.     metFORMIN (GLUCOPHAGE) 500 MG tablet Take 500-1,000 mg by mouth See admin instructions. (2500 mg daily) 1000 mg  am  500 mg lunch 1000 mg pm     metoprolol succinate (TOPROL-XL) 50 MG 24 hr tablet Take 1 tablet (50 mg total) by mouth daily. Take with or immediately following a meal. 90 tablet 3   Multiple Vitamins-Minerals (MULTIVITAMIN WITH MINERALS) tablet Take 1 tablet by mouth daily.     oxyCODONE (ROXICODONE) 5 MG immediate release tablet Take 1 tablet (5 mg total) by mouth every 4 (four) hours as needed for severe pain or breakthrough pain. 5 tablet 0   simvastatin (ZOCOR) 40 MG tablet      vitamin k 100 MCG tablet Take 100 mcg by mouth daily.     No current facility-administered medications for this visit.     Review of Systems  Please see the history of present illness.    (+) Sciatica pain (+) Palpitations  All other systems reviewed and are otherwise negative except as noted above.  Physical Exam    Wt Readings from Last 3 Encounters:  11/07/22 252 lb (114.3 kg)  09/25/22 253 lb (114.8 kg)  06/08/21 253 lb 15.5 oz (115.2 kg)   VS: Vitals:   11/07/22 1521  BP: 128/78  Pulse: 88  SpO2: 98%  ,Body mass index is 35.15 kg/m.  Constitutional:      Appearance: Healthy appearance. Not in distress.  Neck:     Vascular: JVD normal.  Pulmonary:     Effort: Pulmonary effort is normal.     Breath sounds: No wheezing. No rales. Diminished in the bases Cardiovascular:     Normal rate. Regular rhythm. Normal S1. Normal S2.      Murmurs: There is no murmur.  Edema:    Peripheral edema absent.  Abdominal:     Palpations: Abdomen is soft non tender. There is no hepatomegaly.  Skin:    General: Skin is warm and dry.   Neurological:     General: No focal deficit present.     Mental Status: Alert and oriented to person, place and time.     Cranial Nerves: Cranial nerves are intact.  EKG/LABS/Other Studies Reviewed    ECG personally reviewed by me today -none completed today  Lab Results  Component Value Date   WBC 19.1 (H) 06/08/2021   HGB 13.4 06/08/2021   HCT 40.9 06/08/2021   MCV 86.1 06/08/2021   PLT  351 06/08/2021   Lab Results  Component Value Date   CREATININE 1.13 06/08/2021   BUN 21 06/08/2021   NA 134 (L) 06/08/2021   K 3.3 (L) 06/08/2021   CL 98 06/08/2021   CO2 22 06/08/2021   Lab Results  Component Value Date   ALT 120 (H) 06/08/2021   AST 88 (H) 06/08/2021   ALKPHOS 57 06/08/2021   BILITOT 0.8 06/08/2021   Lab Results  Component Value Date   TRIG 210 (H) 12/04/2019    Lab Results  Component Value Date   HGBA1C 8.4 (H) 02/25/2016    Assessment & Plan    1.  Palpitations: -Event monitor completed 01/2019 with no arrhythmia captured and today patient reports no sustained palpitations but occasional flutters -Continue Toprol 50 mg daily  2.  Hypertension: -Patient's blood pressure today was well-controlled at 128/78 -Continue Exforge 10-320 mg and lisinopril 5 mg daily -Patient was encouraged to follow low-sodium heart healthy diet  3.  Hyperlipidemia: -Patient's LDL cholesterol was -Continue Zocor 40 mg daily -He was encouraged to increase physical activity 150 minutes/week  4.  DM type II: -Currently followed by PCP -Last hemoglobin A1c was elevated -Patient was encouraged to follow-up with PCP regarding referral to dietitian for diabetes coordinator  5.  Chest pain: -Patient had Myoview completed showing no significant perfusion or scar with low risk study -Duke treadmill score of 5 low risk     Disposition: Follow-up with Jenkins Rouge, MD or APP as scheduled    Medication Adjustments/Labs and Tests Ordered: Current medicines are reviewed at length  with the patient today.  Concerns regarding medicines are outlined above.   Signed, Mable Fill, Marissa Nestle, NP 11/07/2022, 4:14 PM [facility]  Note:  This document was prepared using Dragon voice recognition software and may include unintentional dictation errors.

## 2022-11-07 ENCOUNTER — Ambulatory Visit: Payer: Commercial Managed Care - PPO | Attending: Nurse Practitioner | Admitting: Nurse Practitioner

## 2022-11-07 ENCOUNTER — Encounter: Payer: Self-pay | Admitting: Nurse Practitioner

## 2022-11-07 VITALS — BP 128/78 | HR 88 | Ht 71.0 in | Wt 252.0 lb

## 2022-11-07 DIAGNOSIS — E118 Type 2 diabetes mellitus with unspecified complications: Secondary | ICD-10-CM

## 2022-11-07 DIAGNOSIS — R002 Palpitations: Secondary | ICD-10-CM

## 2022-11-07 DIAGNOSIS — I10 Essential (primary) hypertension: Secondary | ICD-10-CM

## 2022-11-07 DIAGNOSIS — R072 Precordial pain: Secondary | ICD-10-CM

## 2022-11-07 DIAGNOSIS — Z794 Long term (current) use of insulin: Secondary | ICD-10-CM

## 2022-11-07 DIAGNOSIS — E785 Hyperlipidemia, unspecified: Secondary | ICD-10-CM | POA: Diagnosis not present

## 2022-11-07 NOTE — Patient Instructions (Signed)
Medication Instructions:  Your physician recommends that you continue on your current medications as directed. Please refer to the Current Medication list given to you today.  *If you need a refill on your cardiac medications before your next appointment, please call your pharmacy*   Lab Work: None ordered  If you have labs (blood work) drawn today and your tests are completely normal, you will receive your results only by: [place] (if you have MyChart) OR A paper copy in the mail If you have any lab test that is abnormal or we need to change your treatment, we will call you to review the results.   Testing/Procedures: None ordered   Follow-Up: At [facility], you and your health needs are our priority.  As part of our continuing mission to provide you with exceptional heart care, we have created designated Provider Care Teams.  These Care Teams include your primary Cardiologist (physician) and Advanced Practice Providers (APPs -  Physician Assistants and Nurse Practitioners) who all work together to provide you with the care you need, when you need it.  We recommend signing up for the patient portal called "MyChart".  Sign up information is provided on this After Visit Summary.  MyChart is used to connect with patients for Virtual Visits (Telemedicine).  Patients are able to view lab/test results, encounter notes, upcoming appointments, etc.  Non-urgent messages can be sent to your provider as well.   To learn more about what you can do with MyChart, go to [email or web address].    Your next appointment:   As scheduled    The format for your next appointment:     Provider:       Other Instructions   Important Information About Sugar

## 2023-03-09 NOTE — Progress Notes (Signed)
CARDIOLOGY CONSULT NOTE       Patient ID: [name] MRN: [id number] DOB/AGE: [birth year] 62 y.o.  Admit date: (Not on file) Referring Physician: Sherwood Gambler Primary Physician: Elfredia Nevins, MD Primary Cardiologist: Eden Emms   HPI:  62 y.o. referred by Dr Sherwood Gambler for palpitations  Seen 09/25/22 History of HTN, HLD , DM-2 and GERD. 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 No other changes Some exertional dyspnea and fatigue No syncope Started on Toprol with improvement  Taking care of his dad since October some added stress His dad had an MI  Myovue 09/29/22 normal EF 66% no ischemia  He is very over weight and sedentary works at [employer] and sits in chair all day Has a new [employer] watch that tells him to move all day.   Discussed his BP regimen. Not clear why primary has him on both ACE/ARB and two different diuretics  Would likely be better off with addition of hydralazine, alpha blocker or even changing toprol to normatdyne in future    ROS All other systems reviewed and negative except as noted above  Past Medical History:  Diagnosis Date   Cancer (HCC)    pancreatic   COVID-19    Diabetes mellitus without complication (HCC)    foot bone spur    GERD (gastroesophageal reflux disease)    Hyperlipidemia    Hypertension    Small bowel obstruction (HCC)     Family History  Problem Relation Age of Onset   Stroke Mother    Hypertension Mother    Diabetes Mother    CAD Mother    Colon cancer Neg Hx    Colon polyps Neg Hx     Social History   Socioeconomic History   Marital status: Married     Spouse name: Not on file   Number of children: Not on file   Years of education: Not on file   Highest education level: Not on file  Occupational History   Not on file  Tobacco Use   Smoking status: Never   Smokeless tobacco: Current    Types: Snuff  Vaping Use   Vaping Use: Never used  Substance and Sexual Activity   Alcohol use: Not Currently    Alcohol/week: 1.0 standard drink of alcohol    Types: 1 Cans of beer per week   Drug use: No   Sexual activity: Yes  Other Topics Concern   Not on file  Social History Narrative   Not on file   Social Determinants of Health   Financial Resource Strain: Not on file  Food Insecurity: Not on file  Transportation Needs: Not on file  Physical Activity: Not on file  Stress: Not on file  Social Connections: Not on file  Intimate Partner Violence: Not on file    Past Surgical History:  Procedure Laterality Date   COLONOSCOPY  2007   [place]- sigmoid colon polyp and TI lymphoid hypoplasia but o/w no evidence of crohn's disease, no bx report available.    COLONOSCOPY N/A 08/31/2017  Procedure: COLONOSCOPY;  Surgeon: West Bali, MD;  [facility];  Service: Endoscopy;  Laterality: N/A;  1030    FOOT SURGERY     GIVENS CAPSULE STUDY  10/12/2006   there was no evidence of active GI bleeding, inflammation or erosions on this study   HERNIA REPAIR     as a child   Pancreatic cancer surgery     TONSILLECTOMY        Current Outpatient Medications:    ALPRAZolam (XANAX) 0.5 MG tablet, Take 0.5 mg by mouth 3 (three) times daily as needed., Disp: , Rfl:    amLODipine-valsartan (EXFORGE) 10-320 MG tablet, TAKE 1 TABLET BY MOUTH DAILY, Disp: 7 tablet, Rfl: 0   aspirin EC 81 MG tablet, Take 81 mg by mouth daily., Disp: , Rfl:    Cholecalciferol (D3 5000 PO), Take by mouth., Disp: , Rfl:    furosemide (LASIX) 20 MG tablet, Take 1 tablet by mouth daily. TAKES M-W-F, Disp: , Rfl: 4   hydrochlorothiazide (HYDRODIURIL) 25  MG tablet, Take 25 mg by mouth daily., Disp: , Rfl:    insulin glargine (LANTUS) 100 UNIT/ML injection, Inject 76 Units into the skin every morning. , Disp: , Rfl:    lansoprazole (PREVACID) 15 MG capsule, Take 15 mg by mouth daily. , Disp: , Rfl:    lisinopril (ZESTRIL) 5 MG tablet, Take 5 mg by mouth daily., Disp: , Rfl:    loratadine (CLARITIN) 10 MG tablet, Take 10 mg by mouth daily., Disp: , Rfl:    metFORMIN (GLUCOPHAGE) 500 MG tablet, Take 500-1,000 mg by mouth See admin instructions. (2500 mg daily) 1000 mg  am  500 mg lunch 1000 mg pm, Disp: , Rfl:    metoprolol succinate (TOPROL-XL) 50 MG 24 hr tablet, Take 1 tablet (50 mg total) by mouth daily. Take with or immediately following a meal., Disp: 90 tablet, Rfl: 3   Multiple Vitamins-Minerals (MULTIVITAMIN WITH MINERALS) tablet, Take 1 tablet by mouth daily., Disp: , Rfl:    oxyCODONE (ROXICODONE) 5 MG immediate release tablet, Take 1 tablet (5 mg total) by mouth every 4 (four) hours as needed for severe pain or breakthrough pain., Disp: 5 tablet, Rfl: 0   simvastatin (ZOCOR) 40 MG tablet, , Disp: , Rfl:    vitamin k 100 MCG tablet, Take 100 mcg by mouth daily., Disp: , Rfl:     Physical Exam: Blood pressure (!) 156/74, pulse 85, height 5\' 11"  (1.803 m), weight 257 lb 9.6 oz (116.8 kg), SpO2 98 %.   Affect appropriate Healthy:  appears stated age HEENT: normal Neck supple with no adenopathy JVP normal no bruits no thyromegaly Lungs clear with no wheezing and good diaphragmatic motion Heart:  S1/S2 no murmur, no rub, gallop or click PMI normal Abdomen: benighn, BS positve, no tenderness, no AAA no bruit.  No HSM or HJR Distal pulses intact with no bruits No edema Neuro non-focal Skin warm and dry No muscular weakness   Labs:   Lab Results  Component Value Date   WBC 19.1 (H) 06/08/2021   HGB 13.4 06/08/2021   HCT 40.9 06/08/2021   MCV 86.1 06/08/2021   PLT 351 06/08/2021   No results for input(s): "NA", "K", "CL",  "CO2", "BUN", "CREATININE", "CALCIUM", "PROT", "BILITOT", "ALKPHOS", "ALT", "AST", "GLUCOSE" in the last 168 hours.  Invalid input(s): "LABALBU" No results found for: "CKTOTAL", "CKMB", "CKMBINDEX", "TROPONINI" No results found for: "CHOL" No results found for: "HDL" No results found for: "LDLCALC" Lab Results  Component Value Date   TRIG 210 (H) 12/04/2019   No results found for: "CHOLHDL" No results found for: "LDLDIRECT"    Radiology: No results found.  EKG:  09/25/22 SR PAC nonspecific ST changes    ASSESSMENT AND PLAN:   Palpitations: with negative w/u in past PAC on ECG better on Toprol  HTN:  see above elevated and elevated on home readings Toprol 50 mg  HLD:  On Zocor labs with primary DM:  Discussed low carb diet.  Target hemoglobin A1c is 6.5 or less.  Continue current medications. Dyspnea:  normal exam normal myovue 09/2022 EF 56%' OSA:  related to weight gain on CPAP discussed weight loss and Inpiris device   F/U primary regarding HTN  F/U in a year    Signed: Charlton Haws 03/16/2023, 10:16 AM

## 2023-03-16 ENCOUNTER — Ambulatory Visit: Payer: Commercial Managed Care - PPO | Attending: Cardiovascular Disease | Admitting: Cardiovascular Disease

## 2023-03-16 ENCOUNTER — Encounter: Payer: Self-pay | Admitting: Cardiovascular Disease

## 2023-03-16 VITALS — BP 156/74 | HR 85 | Ht 71.0 in | Wt 257.6 lb

## 2023-03-16 DIAGNOSIS — R002 Palpitations: Secondary | ICD-10-CM

## 2023-03-16 DIAGNOSIS — E118 Type 2 diabetes mellitus with unspecified complications: Secondary | ICD-10-CM | POA: Diagnosis not present

## 2023-03-16 DIAGNOSIS — Z794 Long term (current) use of insulin: Secondary | ICD-10-CM

## 2023-03-16 DIAGNOSIS — E785 Hyperlipidemia, unspecified: Secondary | ICD-10-CM | POA: Diagnosis not present

## 2023-03-16 DIAGNOSIS — I10 Essential (primary) hypertension: Secondary | ICD-10-CM | POA: Diagnosis not present

## 2023-03-16 NOTE — Patient Instructions (Signed)
Medication Instructions:  Your physician recommends that you continue on your current medications as directed. Please refer to the Current Medication list given to you today.  *If you need a refill on your cardiac medications before your next appointment, please call your pharmacy*  Lab Work: If you have labs (blood work) drawn today and your tests are completely normal, you will receive your results only by: MyChart Message (if you have MyChart) OR A paper copy in the mail If you have any lab test that is abnormal or we need to change your treatment, we will call you to review the results.  Testing/Procedures: None ordered today.  Follow-Up: At [facility], you and your health needs are our priority.  As part of our continuing mission to provide you with exceptional heart care, we have created designated Provider Care Teams.  These Care Teams include your primary Cardiologist (physician) and Advanced Practice Providers (APPs -  Physician Assistants and Nurse Practitioners) who all work together to provide you with the care you need, when you need it.  We recommend signing up for the patient portal called "MyChart".  Sign up information is provided on this After Visit Summary.  MyChart is used to connect with patients for Virtual Visits (Telemedicine).  Patients are able to view lab/test results, encounter notes, upcoming appointments, etc.  Non-urgent messages can be sent to your provider as well.   To learn more about what you can do with MyChart, go to [email or web address].    Your next appointment:   1 year(s)  Provider:   Peter Nishan, MD      

## 2023-04-13 ENCOUNTER — Emergency Department (HOSPITAL_COMMUNITY): Payer: Commercial Managed Care - PPO

## 2023-04-13 ENCOUNTER — Emergency Department (HOSPITAL_COMMUNITY)
Admission: EM | Admit: 2023-04-13 | Discharge: 2023-04-13 | Disposition: A | Discharge status: Home / Self Care | Payer: Commercial Managed Care - PPO | Attending: Emergency Medicine | Admitting: Emergency Medicine

## 2023-04-13 ENCOUNTER — Encounter (HOSPITAL_COMMUNITY): Payer: Self-pay

## 2023-04-13 ENCOUNTER — Other Ambulatory Visit: Payer: Self-pay

## 2023-04-13 DIAGNOSIS — Z79899 Other long term (current) drug therapy: Secondary | ICD-10-CM | POA: Diagnosis not present

## 2023-04-13 DIAGNOSIS — Z7984 Long term (current) use of oral hypoglycemic drugs: Secondary | ICD-10-CM | POA: Diagnosis not present

## 2023-04-13 DIAGNOSIS — R1013 Epigastric pain: Secondary | ICD-10-CM | POA: Diagnosis not present

## 2023-04-13 DIAGNOSIS — I10 Essential (primary) hypertension: Secondary | ICD-10-CM | POA: Diagnosis not present

## 2023-04-13 DIAGNOSIS — E119 Type 2 diabetes mellitus without complications: Secondary | ICD-10-CM | POA: Insufficient documentation

## 2023-04-13 DIAGNOSIS — R1084 Generalized abdominal pain: Secondary | ICD-10-CM | POA: Diagnosis present

## 2023-04-13 DIAGNOSIS — Z7982 Long term (current) use of aspirin: Secondary | ICD-10-CM | POA: Insufficient documentation

## 2023-04-13 LAB — URINALYSIS, ROUTINE W REFLEX MICROSCOPIC
Bacteria, UA: NONE SEEN
Bilirubin Urine: NEGATIVE
Glucose, UA: NEGATIVE mg/dL
Hgb urine dipstick: NEGATIVE
Ketones, ur: 5 mg/dL — AB
Leukocytes,Ua: NEGATIVE
Nitrite: NEGATIVE
Protein, ur: 30 mg/dL — AB
Specific Gravity, Urine: 1.02 (ref 1.005–1.030)
pH: 5 (ref 5.0–8.0)

## 2023-04-13 LAB — COMPREHENSIVE METABOLIC PANEL
ALT: 35 U/L (ref 0–44)
AST: 27 U/L (ref 15–41)
Albumin: 3.9 g/dL (ref 3.5–5.0)
Alkaline Phosphatase: 39 U/L (ref 38–126)
Anion gap: 11 (ref 5–15)
BUN: 19 mg/dL (ref 8–23)
CO2: 21 mmol/L — ABNORMAL LOW (ref 22–32)
Calcium: 9.4 mg/dL (ref 8.9–10.3)
Chloride: 101 mmol/L (ref 98–111)
Creatinine, Ser: 0.87 mg/dL (ref 0.61–1.24)
GFR, Estimated: >60 mL/min (ref >60)
Glucose, Bld: 150 mg/dL — ABNORMAL HIGH (ref 70–99)
Potassium: 3.6 mmol/L (ref 3.5–5.1)
Sodium: 133 mmol/L — ABNORMAL LOW (ref 135–145)
Total Bilirubin: 0.7 mg/dL (ref 0.3–1.2)
Total Protein: 7.4 g/dL (ref 6.5–8.1)

## 2023-04-13 LAB — CBC
HCT: 40.8 % (ref 39.0–52.0)
Hemoglobin: 13.1 g/dL (ref 13.0–17.0)
MCH: 27.2 pg (ref 26.0–34.0)
MCHC: 32.1 g/dL (ref 30.0–36.0)
MCV: 84.8 fL (ref 80.0–100.0)
Platelets: 332 10*3/uL (ref 150–400)
RBC: 4.81 MIL/uL (ref 4.22–5.81)
RDW: 14.2 % (ref 11.5–15.5)
WBC: 9 10*3/uL (ref 4.0–10.5)
nRBC: 0 % (ref 0.0–0.2)

## 2023-04-13 LAB — LIPASE, BLOOD: Lipase: 36 U/L (ref 11–51)

## 2023-04-13 MED ORDER — DICYCLOMINE HCL 20 MG PO TABS: ORAL_TABLET | ORAL | 0 refills | Status: DC

## 2023-04-13 NOTE — ED Notes (Signed)
Patient transported to X-ray 

## 2023-04-13 NOTE — ED Triage Notes (Signed)
Pt comes in of complaints of lower abdominal pain starting around 5/12. Pt states he had an URI on 5/10 and rx predinose which is when the abdominal pain along with diarrhea started. Pt states intermittent cramping. Pt also states rt shoulder pain that radiates through the hand.

## 2023-04-13 NOTE — Discharge Instructions (Signed)
Drink plenty of fluids and increase your Prevacid so you are taking it twice a day.  Follow-up with your doctor the end of this week for recheck.  Return if getting worse

## 2023-04-13 NOTE — ED Notes (Signed)
Pt back from x-ray.

## 2023-04-13 NOTE — ED Notes (Signed)
Pt informed he took take his morning medications per MD approval.

## 2023-04-13 NOTE — ED Provider Notes (Signed)
[facility] AT [facility] Provider Note   CSN: [id number] Arrival date & time: 04/13/23  9147     History {Add pertinent medical, surgical, social history, OB history to HPI:1} Chief Complaint  Patient presents with   Abdominal Pain    [name] is a 62 y.o. male.  Patient has a history of diabetes and hypertension.  He had a respiratory infection beginning of May and was placed on prednisone.  He has been having abdominal problems since then.  Patient complains of gas and cramping.  No pain now   Abdominal Pain      Home Medications Prior to Admission medications   Medication Sig Start Date End Date Taking? Authorizing Provider  dicyclomine (BENTYL) 20 MG tablet Take 1 pill every 8-12 hours as needed for abdominal cramps and gas. 04/13/23  Yes Bethann Berkshire, MD  ALPRAZolam Prudy Feeler) 0.5 MG tablet Take 0.5 mg by mouth 3 (three) times daily as needed. 09/07/22   [provider]  amLODipine-valsartan (EXFORGE) 10-320 MG tablet TAKE 1 TABLET BY MOUTH DAILY 12/17/20   Iran Ouch, Lennart Pall, PA-C  aspirin EC 81 MG tablet Take 81 mg by mouth daily.    [provider]  Cholecalciferol (D3 5000 PO) Take by mouth.    [provider]  furosemide (LASIX) 20 MG tablet Take 1 tablet by mouth daily. TAKES M-W-F 07/31/17   [provider]  hydrochlorothiazide (HYDRODIURIL) 25 MG tablet Take 25 mg by mouth daily.    [provider]  insulin glargine (LANTUS) 100 UNIT/ML injection Inject 76 Units into the skin every morning.     [provider]  lansoprazole (PREVACID) 15 MG capsule Take 15 mg by mouth daily.     [provider]  lisinopril (ZESTRIL) 5 MG tablet Take 5 mg by mouth daily. 11/09/19   [provider]  loratadine (CLARITIN) 10 MG tablet Take 10 mg by mouth daily.    [provider]  metFORMIN (GLUCOPHAGE) 500 MG tablet Take 500-1,000 mg by mouth See admin instructions. (2500  mg daily) 1000 mg  am  500 mg lunch 1000 mg pm    [provider]  metoprolol succinate (TOPROL-XL) 50 MG 24 hr tablet Take 1 tablet (50 mg total) by mouth daily. Take with or immediately following a meal. 09/25/22   Wendall Stade, MD  Multiple Vitamins-Minerals (MULTIVITAMIN WITH MINERALS) tablet Take 1 tablet by mouth daily.    [provider]  oxyCODONE (ROXICODONE) 5 MG immediate release tablet Take 1 tablet (5 mg total) by mouth every 4 (four) hours as needed for severe pain or breakthrough pain. 08/16/20   Lucretia Roers, MD  simvastatin (ZOCOR) 40 MG tablet  01/05/19   [provider]  vitamin k 100 MCG tablet Take 100 mcg by mouth daily.    [provider]      Allergies    Cortisone and Penicillins    Review of Systems   Review of Systems  Gastrointestinal:  Positive for abdominal pain.    Physical Exam Updated Vital Signs BP (!) 161/68   Pulse 61   Temp 98.4 F (36.9 C) (Oral)   Resp 15   Ht 5\' 11"  (1.803 m)   Wt 111.1 kg   SpO2 96%   BMI 34.17 kg/m  Physical Exam  ED Results / Procedures / Treatments   Labs (all labs ordered are listed, but only abnormal results are displayed) Labs Reviewed  COMPREHENSIVE METABOLIC PANEL -  Abnormal; Notable for the following components:      Result Value   Sodium 133 (*)    CO2 21 (*)    Glucose, Bld 150 (*)    All other components within normal limits  URINALYSIS, ROUTINE W REFLEX MICROSCOPIC - Abnormal; Notable for the following components:   Ketones, ur 5 (*)    Protein, ur 30 (*)    All other components within normal limits  LIPASE, BLOOD  CBC    EKG None  Radiology DG ABD ACUTE 2+V W 1V CHEST  Result Date: 04/13/2023 CLINICAL DATA:  295621 Pain 144615 EXAM: DG ABDOMEN ACUTE WITH 1 VIEW CHEST COMPARISON:  12/04/2019 FINDINGS: Improved aeration. Minimal residual linear scarring or atelectasis at the left lung base. Heart size normal.  No pleural effusion. No free air.  Stomach is nondistended. Small bowel and colon are nondilated. There are no abnormal calcifications. Regional bones unremarkable. IMPRESSION: Improved aeration. No acute findings. Nonobstructive bowel gas pattern Electronically Signed   By: Corlis Leak M.D.   On: 04/13/2023 08:21    Procedures Procedures  {Document cardiac monitor, telemetry assessment procedure when appropriate:1}  Medications Ordered in ED Medications - No data to display  ED Course/ Medical Decision Making/ A&P   {   Click here for ABCD2, HEART and other calculatorsREFRESH Note before signing :1}                          Medical Decision Making Amount and/or Complexity of Data Reviewed Labs: ordered. Radiology: ordered.  Risk Prescription drug management.   Labs and plain films are unremarkable.  Patient will increase his Prevacid to twice a day and is given some Bentyl and will follow-up with his family doctor  {Document critical care time when appropriate:1} {Document review of labs and clinical decision tools ie heart score, Chads2Vasc2 etc:1}  {Document your independent review of radiology images, and any outside records:1} {Document your discussion with family members, caretakers, and with consultants:1} {Document social determinants of health affecting pt's care:1} {Document your decision making why or why not admission, treatments were needed:1} Final Clinical Impression(s) / ED Diagnoses Final diagnoses:  Epigastric pain    Rx / DC Orders ED Discharge Orders          Ordered    dicyclomine (BENTYL) 20 MG tablet        04/13/23 1034

## 2023-05-11 ENCOUNTER — Ambulatory Visit (HOSPITAL_COMMUNITY)
Admission: RE | Admit: 2023-05-11 | Discharge: 2023-05-11 | Disposition: A | Discharge status: Home / Self Care | Payer: Commercial Managed Care - PPO | Source: Ambulatory Visit | Attending: Internal Medicine | Admitting: Internal Medicine

## 2023-05-11 ENCOUNTER — Other Ambulatory Visit (HOSPITAL_COMMUNITY): Payer: Self-pay | Admitting: Internal Medicine

## 2023-05-11 DIAGNOSIS — M25511 Pain in right shoulder: Secondary | ICD-10-CM

## 2024-03-26 ENCOUNTER — Ambulatory Visit
Admission: EM | Admit: 2024-03-26 | Discharge: 2024-03-26 | Disposition: A | Discharge status: Home / Self Care | Attending: Family Medicine | Admitting: Family Medicine

## 2024-03-26 DIAGNOSIS — R6883 Chills (without fever): Secondary | ICD-10-CM

## 2024-03-26 DIAGNOSIS — R52 Pain, unspecified: Secondary | ICD-10-CM

## 2024-03-26 DIAGNOSIS — R197 Diarrhea, unspecified: Secondary | ICD-10-CM | POA: Diagnosis not present

## 2024-03-26 LAB — POC COVID19/FLU A&B COMBO
Covid Antigen, POC: NEGATIVE
Influenza A Antigen, POC: NEGATIVE
Influenza B Antigen, POC: NEGATIVE

## 2024-03-26 MED ORDER — LOPERAMIDE HCL 2 MG PO CAPS: 2.0000 mg | ORAL_CAPSULE | Freq: Four times a day (QID) | ORAL | 0 refills | Status: DC | PRN

## 2024-03-26 NOTE — ED Triage Notes (Signed)
 Pt reports he has cold chills, fever,  sweats, diarrhea, no appetite, "stomach doesn't feel right", and body aches x  1 day  Took ibuprofen

## 2024-03-27 ENCOUNTER — Other Ambulatory Visit: Payer: Self-pay

## 2024-03-27 ENCOUNTER — Emergency Department (HOSPITAL_COMMUNITY)
Admission: EM | Admit: 2024-03-27 | Discharge: 2024-03-28 | Disposition: A | Discharge status: Home / Self Care | Attending: Emergency Medicine | Admitting: Emergency Medicine

## 2024-03-27 ENCOUNTER — Emergency Department (HOSPITAL_COMMUNITY)

## 2024-03-27 ENCOUNTER — Encounter (HOSPITAL_COMMUNITY): Payer: Self-pay

## 2024-03-27 DIAGNOSIS — I10 Essential (primary) hypertension: Secondary | ICD-10-CM | POA: Insufficient documentation

## 2024-03-27 DIAGNOSIS — Z7984 Long term (current) use of oral hypoglycemic drugs: Secondary | ICD-10-CM | POA: Insufficient documentation

## 2024-03-27 DIAGNOSIS — A09 Infectious gastroenteritis and colitis, unspecified: Secondary | ICD-10-CM | POA: Diagnosis not present

## 2024-03-27 DIAGNOSIS — E876 Hypokalemia: Secondary | ICD-10-CM | POA: Diagnosis not present

## 2024-03-27 DIAGNOSIS — Z7982 Long term (current) use of aspirin: Secondary | ICD-10-CM | POA: Diagnosis not present

## 2024-03-27 DIAGNOSIS — Z79899 Other long term (current) drug therapy: Secondary | ICD-10-CM | POA: Insufficient documentation

## 2024-03-27 DIAGNOSIS — D72829 Elevated white blood cell count, unspecified: Secondary | ICD-10-CM | POA: Insufficient documentation

## 2024-03-27 DIAGNOSIS — E119 Type 2 diabetes mellitus without complications: Secondary | ICD-10-CM | POA: Insufficient documentation

## 2024-03-27 DIAGNOSIS — R509 Fever, unspecified: Secondary | ICD-10-CM | POA: Diagnosis present

## 2024-03-27 LAB — DIFFERENTIAL
Abs Immature Granulocytes: 0.03 10*3/uL (ref 0.00–0.07)
Basophils Absolute: 0.1 10*3/uL (ref 0.0–0.1)
Basophils Relative: 0 %
Eosinophils Absolute: 0 10*3/uL (ref 0.0–0.5)
Eosinophils Relative: 0 %
Immature Granulocytes: 0 %
Lymphocytes Relative: 9 %
Lymphs Abs: 1 10*3/uL (ref 0.7–4.0)
Monocytes Absolute: 1.5 10*3/uL — ABNORMAL HIGH (ref 0.1–1.0)
Monocytes Relative: 13 %
Neutro Abs: 9.1 10*3/uL — ABNORMAL HIGH (ref 1.7–7.7)
Neutrophils Relative %: 78 %

## 2024-03-27 LAB — URINALYSIS, ROUTINE W REFLEX MICROSCOPIC
Bacteria, UA: NONE SEEN
Bilirubin Urine: NEGATIVE
Glucose, UA: NEGATIVE mg/dL
Hgb urine dipstick: NEGATIVE
Ketones, ur: 5 mg/dL — AB
Leukocytes,Ua: NEGATIVE
Nitrite: NEGATIVE
Protein, ur: 100 mg/dL — AB
Specific Gravity, Urine: 1.025 (ref 1.005–1.030)
pH: 5 (ref 5.0–8.0)

## 2024-03-27 LAB — COMPREHENSIVE METABOLIC PANEL WITH GFR
ALT: 21 U/L (ref 0–44)
AST: 19 U/L (ref 15–41)
Albumin: 3.2 g/dL — ABNORMAL LOW (ref 3.5–5.0)
Alkaline Phosphatase: 49 U/L (ref 38–126)
Anion gap: 10 (ref 5–15)
BUN: 15 mg/dL (ref 8–23)
CO2: 19 mmol/L — ABNORMAL LOW (ref 22–32)
Calcium: 8.4 mg/dL — ABNORMAL LOW (ref 8.9–10.3)
Chloride: 100 mmol/L (ref 98–111)
Creatinine, Ser: 0.93 mg/dL (ref 0.61–1.24)
GFR, Estimated: >60 mL/min (ref >60)
Glucose, Bld: 164 mg/dL — ABNORMAL HIGH (ref 70–99)
Potassium: 2.9 mmol/L — ABNORMAL LOW (ref 3.5–5.1)
Sodium: 129 mmol/L — ABNORMAL LOW (ref 135–145)
Total Bilirubin: 0.8 mg/dL (ref 0.0–1.2)
Total Protein: 7.1 g/dL (ref 6.5–8.1)

## 2024-03-27 LAB — RESP PANEL BY RT-PCR (RSV, FLU A&B, COVID)  RVPGX2
Influenza A by PCR: NEGATIVE
Influenza B by PCR: NEGATIVE
Resp Syncytial Virus by PCR: NEGATIVE
SARS Coronavirus 2 by RT PCR: NEGATIVE

## 2024-03-27 LAB — CBC
HCT: 39.3 % (ref 39.0–52.0)
Hemoglobin: 13.2 g/dL (ref 13.0–17.0)
MCH: 27.7 pg (ref 26.0–34.0)
MCHC: 33.6 g/dL (ref 30.0–36.0)
MCV: 82.6 fL (ref 80.0–100.0)
Platelets: 303 10*3/uL (ref 150–400)
RBC: 4.76 MIL/uL (ref 4.22–5.81)
RDW: 13.5 % (ref 11.5–15.5)
WBC: 11.7 10*3/uL — ABNORMAL HIGH (ref 4.0–10.5)
nRBC: 0 % (ref 0.0–0.2)

## 2024-03-27 LAB — LIPASE, BLOOD: Lipase: 21 U/L (ref 11–51)

## 2024-03-27 LAB — MAGNESIUM: Magnesium: 1.6 mg/dL — ABNORMAL LOW (ref 1.7–2.4)

## 2024-03-27 MED ORDER — POTASSIUM CHLORIDE 10 MEQ/100ML IV SOLN
10.0000 meq | Freq: Once | INTRAVENOUS | Status: AC
  Administered 2024-03-27: 10 meq via INTRAVENOUS
  Filled 2024-03-27: qty 100

## 2024-03-27 MED ORDER — MAG-OXIDE 200 MG PO TABS: 200.0000 mg | ORAL_TABLET | Freq: Every day | ORAL | 0 refills | Status: AC

## 2024-03-27 MED ORDER — ACETAMINOPHEN 325 MG PO TABS
650.0000 mg | ORAL_TABLET | Freq: Once | ORAL | Status: AC
  Administered 2024-03-27: 650 mg via ORAL
  Filled 2024-03-27: qty 2

## 2024-03-27 MED ORDER — POTASSIUM CHLORIDE CRYS ER 20 MEQ PO TBCR: 20.0000 meq | EXTENDED_RELEASE_TABLET | Freq: Two times a day (BID) | ORAL | 0 refills | Status: AC

## 2024-03-27 MED ORDER — METRONIDAZOLE 500 MG PO TABS
500.0000 mg | ORAL_TABLET | Freq: Once | ORAL | Status: AC
  Administered 2024-03-28: 500 mg via ORAL
  Filled 2024-03-27: qty 1

## 2024-03-27 MED ORDER — IOHEXOL 300 MG/ML  SOLN
100.0000 mL | Freq: Once | INTRAMUSCULAR | Status: AC | PRN
  Administered 2024-03-27: 100 mL via INTRAVENOUS

## 2024-03-27 MED ORDER — CIPROFLOXACIN HCL 500 MG PO TABS: 500.0000 mg | ORAL_TABLET | Freq: Two times a day (BID) | ORAL | 0 refills | Status: DC

## 2024-03-27 MED ORDER — METRONIDAZOLE 500 MG PO TABS: 500.0000 mg | ORAL_TABLET | Freq: Two times a day (BID) | ORAL | 0 refills | Status: DC

## 2024-03-27 MED ORDER — CIPROFLOXACIN HCL 250 MG PO TABS
500.0000 mg | ORAL_TABLET | Freq: Once | ORAL | Status: AC
  Administered 2024-03-28: 500 mg via ORAL
  Filled 2024-03-27: qty 2

## 2024-03-27 MED ORDER — POTASSIUM CHLORIDE CRYS ER 20 MEQ PO TBCR
40.0000 meq | EXTENDED_RELEASE_TABLET | Freq: Once | ORAL | Status: AC
  Administered 2024-03-27: 40 meq via ORAL
  Filled 2024-03-27: qty 2

## 2024-03-27 MED ORDER — LACTATED RINGERS IV BOLUS
1000.0000 mL | Freq: Once | INTRAVENOUS | Status: AC
  Administered 2024-03-27: 1000 mL via INTRAVENOUS

## 2024-03-27 NOTE — ED Provider Notes (Signed)
 [facility]    CSN: [id number] Arrival date & time: 03/26/24  1406      History   Chief Complaint No chief complaint on file.   HPI [name] is a 63 y.o. male.   Patient presenting today with 1 day history of fever, chills, sweats, diarrhea, decreased appetite, body aches, fatigue.  Denies chest pain, cough, congestion, sore throat, rashes.  So far trying ibuprofen with minimal relief.  No new foods, new medications, recent sick contacts.    Past Medical History:  Diagnosis Date   Cancer (HCC)    pancreatic   COVID-19    Diabetes mellitus without complication (HCC)    foot bone spur    GERD (gastroesophageal reflux disease)    Hyperlipidemia    Hypertension    Small bowel obstruction [facility])     Patient Active Problem List   Diagnosis Date Noted   Sebaceous cyst 01/20/2019   Special screening for malignant neoplasms, colon    Umbilical hernia 12/08/2016   Neuroendocrine carcinoma of pancreas (HCC) 04/14/2016   Mass of pancreas    Pancreatic mass 02/25/2016   Ileus (HCC) 02/25/2016   Nausea with vomiting 02/25/2016   Dehydration 02/25/2016    Past Surgical History:  Procedure Laterality Date   COLONOSCOPY  2007   [place]- sigmoid colon polyp and TI lymphoid hypoplasia but o/w no evidence of crohn's disease, no bx report available.    COLONOSCOPY N/A 08/31/2017   Procedure: COLONOSCOPY;  Surgeon: Alyce Jubilee, MD;  Location[facility];  Service: Endoscopy;  Laterality: N/A;  1030    FOOT SURGERY     GIVENS CAPSULE STUDY  10/12/2006   there was no evidence of active GI bleeding, inflammation or erosions on this study   HERNIA REPAIR     as a child   Pancreatic cancer surgery     TONSILLECTOMY         Home Medications    Prior to Admission medications   Medication Sig Start Date End Date Taking? Authorizing Provider  loperamide (IMODIUM) 2 MG capsule Take 1 capsule (2 mg total) by mouth 4 (four) times daily as needed for  diarrhea or loose stools. 03/26/24  Yes Corbin Dess, PA-C  ALPRAZolam (XANAX) 0.5 MG tablet Take 0.5 mg by mouth 3 (three) times daily as needed. 09/07/22   [provider]  amLODipine -valsartan  (EXFORGE ) 10-320 MG tablet TAKE 1 TABLET BY MOUTH DAILY 12/17/20   Strader, Dimple Francis, PA-C  aspirin EC 81 MG tablet Take 81 mg by mouth daily.    [provider]  Cholecalciferol (D3 5000 PO) Take by mouth.    [provider]  dicyclomine  (BENTYL ) 20 MG tablet Take 1 pill every 8-12 hours as needed for abdominal cramps and gas. 04/13/23   Zammit, Joseph, MD  furosemide (LASIX) 20 MG tablet Take 1 tablet by mouth daily. TAKES M-W-F 07/31/17   [provider]  hydrochlorothiazide (HYDRODIURIL) 25 MG tablet Take 25 mg by mouth daily.    [provider]  insulin  glargine (LANTUS) 100 UNIT/ML injection Inject 76 Units into the skin every morning.     [provider]  lansoprazole (PREVACID) 15 MG capsule Take 15 mg by mouth daily.     [provider]  lisinopril (ZESTRIL) 5 MG tablet Take 5 mg by mouth daily. 11/09/19   [provider]  loratadine (CLARITIN) 10 MG tablet Take 10 mg by mouth daily.    [provider]  metFORMIN (GLUCOPHAGE) 500 MG tablet Take 500-1,000 mg by mouth See admin instructions. (2500 mg daily) 1000 mg  am  500 mg lunch 1000 mg pm    [provider]  metoprolol  succinate (TOPROL -XL) 50 MG 24 hr tablet Take 1 tablet (50 mg total) by mouth daily. Take with or immediately following a meal. 09/25/22   Nishan, Peter C, MD  Multiple Vitamins-Minerals (MULTIVITAMIN WITH MINERALS) tablet Take 1 tablet by mouth daily.    [provider]  oxyCODONE  (ROXICODONE ) 5 MG immediate release tablet Take 1 tablet (5 mg total) by mouth every 4 (four) hours as needed for severe pain or breakthrough pain. 08/16/20   Awilda Bogus, MD  simvastatin (ZOCOR) 40 MG tablet  01/05/19   [provider]  vitamin k 100 MCG tablet Take 100 mcg by mouth daily.    [provider]    Family History Family History  Problem Relation Age of Onset   Stroke Mother    Hypertension Mother    Diabetes Mother    CAD Mother    Colon cancer Neg Hx    Colon polyps Neg Hx     Social History Social History   Tobacco Use   Smoking status: Never   Smokeless tobacco: Current    Types: Snuff  Vaping Use   Vaping status: Never Used  Substance Use Topics   Alcohol use: Not Currently    Alcohol/week: 1.0 standard drink of alcohol    Types: 1 Cans of beer per week   Drug use: No     Allergies   Cortisone and Penicillins   Review of Systems Review of Systems Per HPI  Physical Exam Triage Vital Signs ED Triage Vitals  Encounter Vitals Group     BP 03/26/24 1445 (!) 145/84     Systolic BP Percentile --      Diastolic BP Percentile --      Pulse Rate 03/26/24 1445 94     Resp 03/26/24 1445 18     Temp 03/26/24 1445 98.9 F (37.2 C)     Temp Source 03/26/24 1445 Oral     SpO2 03/26/24 1445 94 %     Weight --      Height --      Head Circumference --      Peak Flow --      Pain Score 03/26/24 1443 3     Pain Loc --      Pain Education --      Exclude from Growth Chart --    No data found.  Updated Vital Signs BP (!) 145/84 (BP Location: Right Arm)   Pulse 94   Temp 98.9 F (37.2 C) (Oral)   Resp 18   SpO2 94%   Visual Acuity Right Eye Distance:   Left Eye Distance:   Bilateral Distance:    Right Eye Near:   Left Eye Near:    Bilateral Near:     Physical Exam Vitals and nursing note reviewed.  Constitutional:      Appearance: Normal appearance.  HENT:     Head: Atraumatic.     Mouth/Throat:     Mouth: Mucous membranes are moist.     Pharynx: Oropharynx is clear.  Eyes:     Extraocular Movements: Extraocular movements intact.     Conjunctiva/sclera: Conjunctivae normal.  Cardiovascular:     Rate and Rhythm: Normal rate and regular rhythm.   Pulmonary:     Effort: Pulmonary effort is  normal.     Breath sounds: Normal breath sounds.  Abdominal:     General: Bowel sounds are normal. There is no distension.     Palpations: Abdomen is soft. There is no mass.     Tenderness: There is abdominal tenderness. There is no right CVA tenderness or guarding.     Comments: Mild diffuse tenderness to palpation without distention or guarding  Musculoskeletal:        General: Normal range of motion.     Cervical back: Normal range of motion and neck supple.  Skin:    General: Skin is warm and dry.  Neurological:     General: No focal deficit present.     Mental Status: He is oriented to person, place, and time.  Psychiatric:        Mood and Affect: Mood normal.        Thought Content: Thought content normal.        Judgment: Judgment normal.      UC Treatments / Results  Labs (all labs ordered are listed, but only abnormal results are displayed) Labs Reviewed  POC COVID19/FLU A&B COMBO    EKG   Radiology No results found.  Procedures Procedures (including critical care time)  Medications Ordered in UC Medications - No data to display  Initial Impression / Assessment and Plan / UC Course  I have reviewed the triage vital signs and the nursing notes.  Pertinent labs & imaging results that were available during my care of the patient were reviewed by me and considered in my medical decision making (see chart for details).     Suspect viral GI illness.  Treat with Imodium, over-the-counter pain relievers, brat diet, fluids.  Return for worsening symptoms.  Final Clinical Impressions(s) / UC Diagnoses   Final diagnoses:  Diarrhea, unspecified type  Chills  Body aches   Discharge Instructions   None    ED Prescriptions     Medication Sig Dispense Auth. Provider   loperamide (IMODIUM) 2 MG capsule Take 1 capsule (2 mg total) by mouth 4 (four) times daily as needed for diarrhea or loose stools. 12 capsule Corbin Dess, [place]      PDMP not reviewed this encounter.   Corbin Dess, [place] 03/27/24 1541

## 2024-03-27 NOTE — ED Provider Notes (Signed)
 [name] [facility] AT [facility] Provider Note   CSN: [id number] Arrival date & time: 03/27/24  1825     History  Chief Complaint  Patient presents with   Diarrhea    [name] is a 63 y.o. male.  HPI 63 year old male with a history of diabetes and hypertension presents with fever.  Fever and sweats started 2 days ago.  He is also had some generalized bodyaches.  He has had a little bit of left sided abdominal pain, mild diarrhea, and some back pain.  He denies headache, cough, sore throat, shortness of breath, urinary symptoms.  No vomiting but has had about 2 episodes of diarrhea per day.  No blood in the stools.  Yesterday he noticed some nonspecific mid back pain across his entire back.  He has also been having some left lower quadrant abdominal pain.  He denies any incontinence or weakness/numbness in his extremities.  Home Medications Prior to Admission medications   Medication Sig Start Date End Date Taking? Authorizing Provider  ciprofloxacin (CIPRO) 500 MG tablet Take 1 tablet (500 mg total) by mouth 2 (two) times daily. One po bid x 7 days 03/27/24  Yes Jerilynn Montenegro, MD  Magnesium Oxide -Mg Supplement (MAG-OXIDE) 200 MG TABS Take 1 tablet (200 mg total) by mouth daily for 3 days. 03/27/24 03/30/24 Yes Jerilynn Montenegro, MD  metroNIDAZOLE (FLAGYL) 500 MG tablet Take 1 tablet (500 mg total) by mouth 2 (two) times daily. 03/27/24  Yes Jerilynn Montenegro, MD  potassium chloride SA (KLOR-CON M) 20 MEQ tablet Take 1 tablet (20 mEq total) by mouth 2 (two) times daily for 3 days. 03/27/24 03/30/24 Yes Jerilynn Montenegro, MD  ALPRAZolam (XANAX) 0.5 MG tablet Take 0.5 mg by mouth 3 (three) times daily as needed. 09/07/22   [provider]  amLODipine -valsartan  (EXFORGE ) 10-320 MG tablet TAKE 1 TABLET BY MOUTH DAILY 12/17/20   Finis Hugger, Dimple Francis, PA-C  aspirin EC 81 MG tablet Take 81 mg by mouth daily.    [provider]  Cholecalciferol (D3 5000 PO)  Take by mouth.    [provider]  dicyclomine  (BENTYL ) 20 MG tablet Take 1 pill every 8-12 hours as needed for abdominal cramps and gas. 04/13/23   Zammit, Joseph, MD  furosemide (LASIX) 20 MG tablet Take 1 tablet by mouth daily. TAKES M-W-F 07/31/17   [provider]  hydrochlorothiazide (HYDRODIURIL) 25 MG tablet Take 25 mg by mouth daily.    [provider]  insulin  glargine (LANTUS) 100 UNIT/ML injection Inject 76 Units into the skin every morning.     [provider]  lansoprazole (PREVACID) 15 MG capsule Take 15 mg by mouth daily.     [provider]  lisinopril (ZESTRIL) 5 MG tablet Take 5 mg by mouth daily. 11/09/19   [provider]  loperamide (IMODIUM) 2 MG capsule Take 1 capsule (2 mg total) by mouth 4 (four) times daily as needed for diarrhea or loose stools. 03/26/24   Corbin Dess, PA-C  loratadine (CLARITIN) 10 MG tablet Take 10 mg by mouth daily.    [provider]  metFORMIN (GLUCOPHAGE) 500 MG tablet Take 500-1,000 mg by mouth See admin instructions. (2500 mg daily) 1000 mg  am  500 mg lunch 1000 mg pm    [provider]  metoprolol  succinate (TOPROL -XL) 50 MG 24 hr tablet Take 1 tablet (50 mg total) by mouth daily. Take with or immediately following a meal. 09/25/22   Nishan,  Herma Longest, MD  Multiple Vitamins-Minerals (MULTIVITAMIN WITH MINERALS) tablet Take 1 tablet by mouth daily.    [provider]  oxyCODONE  (ROXICODONE ) 5 MG immediate release tablet Take 1 tablet (5 mg total) by mouth every 4 (four) hours as needed for severe pain or breakthrough pain. 08/16/20   Awilda Bogus, MD  simvastatin (ZOCOR) 40 MG tablet  01/05/19   [provider]  vitamin k 100 MCG tablet Take 100 mcg by mouth daily.    [provider]      Allergies    Cortisone and Penicillins    Review of Systems   Review of Systems  Constitutional:  Positive for chills, diaphoresis and fever.   HENT:  Negative for sore throat.   Respiratory:  Negative for cough and shortness of breath.   Cardiovascular:  Negative for chest pain.  Gastrointestinal:  Positive for abdominal pain and diarrhea. Negative for blood in stool and vomiting.  Genitourinary:  Negative for dysuria.  Musculoskeletal:  Positive for back pain.  Skin:  Negative for rash.  Neurological:  Negative for weakness and numbness.    Physical Exam Updated Vital Signs BP (!) 150/75   Pulse 90   Temp 98.5 F (36.9 C) (Oral)   Resp 19   Ht 5\' 11"  (1.803 m)   Wt 113.4 kg   SpO2 95%   BMI 34.87 kg/m  Physical Exam Vitals and nursing note reviewed.  Constitutional:      Appearance: He is well-developed.  HENT:     Head: Normocephalic and atraumatic.  Cardiovascular:     Rate and Rhythm: Normal rate and regular rhythm.     Heart sounds: Normal heart sounds.  Pulmonary:     Effort: Pulmonary effort is normal.     Breath sounds: Normal breath sounds. No wheezing or rales.  Abdominal:     General: There is no distension.     Palpations: Abdomen is soft.     Tenderness: There is no abdominal tenderness.  Musculoskeletal:     Thoracic back: No tenderness.     Lumbar back: No tenderness.     Comments: No back tenderness or rash noted  Skin:    General: Skin is warm and dry.  Neurological:     Mental Status: He is alert.     Comments: 5/5 strength in both lower extremities, grossly normal sensation.     ED Results / Procedures / Treatments   Labs (all labs ordered are listed, but only abnormal results are displayed) Labs Reviewed  COMPREHENSIVE METABOLIC PANEL WITH GFR - Abnormal; Notable for the following components:      Result Value   Sodium 129 (*)    Potassium 2.9 (*)    CO2 19 (*)    Glucose, Bld 164 (*)    Calcium 8.4 (*)    Albumin 3.2 (*)    All other components within normal limits  CBC - Abnormal; Notable for the following components:   WBC 11.7 (*)    All other components within  normal limits  URINALYSIS, ROUTINE W REFLEX MICROSCOPIC - Abnormal; Notable for the following components:   Color, Urine AMBER (*)    Ketones, ur 5 (*)    Protein, ur 100 (*)    All other components within normal limits  DIFFERENTIAL - Abnormal; Notable for the following components:   Neutro Abs 9.1 (*)    Monocytes Absolute 1.5 (*)    All other components within normal limits  MAGNESIUM - Abnormal;  Notable for the following components:   Magnesium 1.6 (*)    All other components within normal limits  RESP PANEL BY RT-PCR (RSV, FLU A&B, COVID)  RVPGX2  CULTURE, BLOOD (ROUTINE X 2)  CULTURE, BLOOD (ROUTINE X 2)  LIPASE, BLOOD    EKG ED ECG REPORT   Date: 03/27/2024  Rate: 86  Rhythm: normal sinus rhythm  QRS Axis: normal  Intervals: normal  ST/T Wave abnormalities: nonspecific T wave changes  Conduction Disutrbances:none  Narrative Interpretation:   Old EKG Reviewed: unchanged  I have personally reviewed the EKG tracing and agree with the computerized printout as noted.   Radiology CT ABDOMEN PELVIS W CONTRAST Result Date: 03/27/2024 CLINICAL DATA:  Sepsis EXAM: CT ABDOMEN AND PELVIS WITH CONTRAST TECHNIQUE: Multidetector CT imaging of the abdomen and pelvis was performed using the standard protocol following bolus administration of intravenous contrast. RADIATION DOSE REDUCTION: This exam was performed according to the departmental dose-optimization program which includes automated exposure control, adjustment of the mA and/or kV according to patient size and/or use of iterative reconstruction technique. CONTRAST:  100mL OMNIPAQUE IOHEXOL 300 MG/ML  SOLN COMPARISON:  None Available. FINDINGS: Lower chest: No acute abnormality. Hepatobiliary: Mild hepatomegaly and hepatic steatosis. No enhancing intrahepatic mass. No intra or extrahepatic biliary ductal dilation. Gallbladder unremarkable. Pancreas: Surgical changes of distal pancreatectomy are identified. Unremarkable appearance of  the residual pancreas. Spleen: Unremarkable Adrenals/Urinary Tract: 17 mm nodule within the right adrenal gland is not optimally characterized on this examination, but is stable since remote prior examination of 10/26/2017 is most in keeping with a a benign adrenal adenoma. Left adrenal gland is unremarkable. Kidneys are unremarkable. Bladder is unremarkable. Stomach/Bowel: There is circumferential bowel wall thickening mild pericolonic inflammatory stranding involving the ascending colon extending through the hepatic flexure in keeping with an infectious or inflammatory colitis. There is adjacent shotty mesenteric adenopathy within the colonic mesentery, likely reactive in nature, measuring to 13 mm in short axis diameter. The stomach, small bowel, and large bowel are otherwise unremarkable. No evidence of obstruction or perforation. No free intraperitoneal gas or fluid. The appendix is unremarkable. Vascular/Lymphatic: Mild aortoiliac atherosclerotic calcification. The abdominal vasculature is otherwise. No frankly pathologic adenopathy within the abdomen pelvis. Reproductive: Mild prostatic hypertrophy Other: No abdominal wall hernia or abnormality. No abdominopelvic ascites. Musculoskeletal: No acute or significant osseous findings. Unilateral right L5 pars defect. IMPRESSION: 1. Circumferential bowel wall thickening and pericolonic inflammatory stranding involving the ascending colon extending through the hepatic flexure in keeping with an infectious or inflammatory colitis. Associated reactive mesenteric adenopathy. No evidence of obstruction or perforation. 2. Mild hepatomegaly and hepatic steatosis. 3. 17 mm right adrenal nodule, stable since remote prior examination of 10/26/2017 and most in keeping with a benign adrenal adenoma. Aortic Atherosclerosis (ICD10-I70.0). Electronically Signed   By: Worthy Heads M.D.   On: 03/27/2024 23:21   DG Chest 2 View Result Date: 03/27/2024 CLINICAL DATA:  Fever  EXAM: CHEST - 2 VIEW COMPARISON:  04/13/2023 FINDINGS: Heart and mediastinal contours are within normal limits. No focal opacities or effusions. No acute bony abnormality. IMPRESSION: No active cardiopulmonary disease. Electronically Signed   By: Janeece Mechanic M.D.   On: 03/27/2024 23:17    Procedures Procedures    Medications Ordered in ED Medications  potassium chloride 10 mEq in 100 mL IVPB (10 mEq Intravenous New Bag/Given 03/27/24 2250)  metroNIDAZOLE (FLAGYL) tablet 500 mg (has no administration in time range)  ciprofloxacin (CIPRO) tablet 500 mg (has no administration in time range)  lactated ringers bolus 1,000 mL (1,000 mLs Intravenous New Bag/Given 03/27/24 2250)  acetaminophen  (TYLENOL ) tablet 650 mg (650 mg Oral Given 03/27/24 2246)  potassium chloride SA (KLOR-CON M) CR tablet 40 mEq (40 mEq Oral Given 03/27/24 2245)  iohexol (OMNIPAQUE) 300 MG/ML solution 100 mL (100 mLs Intravenous Contrast Given 03/27/24 2302)    ED Course/ Medical Decision Making/ A&P                                 Medical Decision Making Amount and/or Complexity of Data Reviewed Labs: ordered.    Details: Mild leukocytosis.  Hypokalemia and hypomagnesemia. Radiology: ordered and independent interpretation performed.    Details: Colitis, no pneumonia. ECG/medicine tests: ordered and independent interpretation performed.    Details: Normal QTC  Risk OTC drugs. Prescription drug management.   Patient presents with fever, mild abdominal/back pain and some diarrhea.  He is overall well-appearing and feels a lot better after being given a fluid bolus and some Tylenol .  He does have some moderate hypokalemia and hypomagnesemia which will be repleted.  CT shows some colitis which I think explains his symptoms and fever.  He otherwise has a mildly low bicarbonate but a normal anion gap which I suspect is diarrhea related.  I will put him on Cipro and Flagyl to cover the colitis.  He has not had any bowel  movements in the multiple hours he has been here.  Will have him follow-up closely with his PCP and give return precautions.        Final Clinical Impression(s) / ED Diagnoses Final diagnoses:  Infectious colitis  Hypokalemia  Hypomagnesemia    Rx / DC Orders ED Discharge Orders          Ordered    potassium chloride SA (KLOR-CON M) 20 MEQ tablet  2 times daily        03/27/24 2332    Magnesium Oxide -Mg Supplement (MAG-OXIDE) 200 MG TABS  Daily        03/27/24 2332    ciprofloxacin (CIPRO) 500 MG tablet  2 times daily        03/27/24 2332    metroNIDAZOLE (FLAGYL) 500 MG tablet  2 times daily        03/27/24 2332              Jerilynn Montenegro, MD 03/27/24 2342

## 2024-03-27 NOTE — ED Triage Notes (Addendum)
 Pt presents with fever, chills, diarrhea, myalgia, and anorexia that started 3 days ago. He has a hx of DM2 and his CBG has been elevated around 230's. He denies sick contacts or questionable food sources. Pt was seen at [facility] yesterday and was started on loperamide which has helped with the diarrhea. Pt also has been alternating Excedrin and ibuprofen for fever. Last took an Excedrin at 10:30 this AM.

## 2024-03-27 NOTE — ED Notes (Signed)
Pt in CT. Kellogg RN

## 2024-03-27 NOTE — Discharge Instructions (Signed)
 We are treating you with 2 different antibiotics to treat your colon infection.  We are also replacing your potassium and magnesium which can get low when you have diarrhea.  Be sure to drink increased fluids.  Take Tylenol  and/or ibuprofen to help with your fever.  If you develop new or worsening pain, the fever does not go away in about 48 hours, vomiting, blood in the stools, or any other new/concerning symptoms then return to the ER.

## 2024-03-28 DIAGNOSIS — A09 Infectious gastroenteritis and colitis, unspecified: Secondary | ICD-10-CM | POA: Diagnosis not present

## 2024-04-01 LAB — CULTURE, BLOOD (ROUTINE X 2)
Culture: NO GROWTH
Culture: NO GROWTH
Special Requests: ADEQUATE
Special Requests: ADEQUATE

## 2024-06-27 NOTE — Progress Notes (Signed)
 CARDIOLOGY CONSULT NOTE       Patient ID: [name] MRN: [id number] DOB/AGE: 1961-11-03 63 y.o.  Admit date: (Not on file) Referring Physician: Bertell Primary Physician: Bertell Satterfield, MD Primary Cardiologist: Delford   HPI:  63 y.o. referred by Dr Bertell for palpitations  Seen 09/25/22 History of HTN, HLD , DM-2 and GERD. 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 No other changes Some exertional dyspnea and fatigue No syncope Started on Toprol  with improvement  Taking care of his dad since October 2024  some added stress His dad had an MI  Myovue 09/29/22 normal EF 66% no ischemia  He is very over weight and sedentary works at [employer] and sits in chair all day Has a new [employer] watch that tells him to move all day.   Discussed his BP regimen. Not clear why primary has him on both ACE/ARB and two different diuretics  Would likely be better off with addition of hydralazine , alpha blocker or even changing toprol  to normatdyne in future   Discussed taking lasix at lunch Also discussed GLP-1 med He's had partial pancreatic resection years ago but not pancreatitits   ROS All other systems reviewed and negative except as noted above  Past Medical History:  Diagnosis Date   Cancer (HCC)    pancreatic   COVID-19    Diabetes mellitus without complication (HCC)    foot bone spur    GERD (gastroesophageal reflux disease)    Hyperlipidemia    Hypertension    Small bowel obstruction (HCC)     Family History  Problem Relation Age of Onset   Stroke Mother    Hypertension Mother    Diabetes Mother     CAD Mother    Colon cancer Neg Hx    Colon polyps Neg Hx     Social History   Socioeconomic History   Marital status: Married    Spouse name: Not on file   Number of children: Not on file   Years of education: Not on file   Highest education level: Not on file  Occupational History   Not on file  Tobacco Use   Smoking status: Never   Smokeless tobacco: Current    Types: Snuff  Vaping Use   Vaping status: Never Used  Substance and Sexual Activity   Alcohol use: Not Currently    Alcohol/week: 1.0 standard drink of alcohol    Types: 1 Cans of beer per week   Drug use: No   Sexual activity: Yes  Other Topics Concern   Not on file  Social History Narrative   Not on file   Social Drivers of Health   Financial Resource Strain: Not on file  Food Insecurity: Not on file  Transportation Needs: Not on file  Physical Activity: Not on file  Stress: Not on file  Social Connections: Not on file  Intimate Partner Violence: Not on file    Past Surgical History:  Procedure Laterality Date   COLONOSCOPY  2007   [place]- sigmoid colon polyp and  TI lymphoid hypoplasia but o/w no evidence of crohn's disease, no bx report available.    COLONOSCOPY N/A 08/31/2017   Procedure: COLONOSCOPY;  Surgeon: Harvey Margo CROME, MD;  Location: [facility];  Service: Endoscopy;  Laterality: N/A;  1030    FOOT SURGERY     GIVENS CAPSULE STUDY  10/12/2006   there was no evidence of active GI bleeding, inflammation or erosions on this study   HERNIA REPAIR     as a child   Pancreatic cancer surgery     TONSILLECTOMY        Current Outpatient Medications:    ALPRAZolam (XANAX) 0.5 MG tablet, Take 0.5 mg by mouth 3 (three) times daily as needed., Disp: , Rfl:    amLODipine -valsartan  (EXFORGE ) 10-320 MG tablet, TAKE 1 TABLET BY MOUTH DAILY, Disp: 7 tablet, Rfl: 0   aspirin EC 81 MG tablet, Take 81 mg by mouth daily., Disp: , Rfl:    Cholecalciferol (D3 5000 PO), Take by mouth., Disp: , Rfl:     furosemide (LASIX) 20 MG tablet, Take 1 tablet by mouth daily. TAKES M-W-F, Disp: , Rfl: 4   insulin  glargine (LANTUS) 100 UNIT/ML injection, Inject 76 Units into the skin every morning. , Disp: , Rfl:    lansoprazole (PREVACID) 15 MG capsule, Take 15 mg by mouth daily. , Disp: , Rfl:    lisinopril (ZESTRIL) 5 MG tablet, Take 5 mg by mouth daily., Disp: , Rfl:    loratadine (CLARITIN) 10 MG tablet, Take 10 mg by mouth daily., Disp: , Rfl:    metFORMIN (GLUCOPHAGE) 500 MG tablet, Take 500-1,000 mg by mouth See admin instructions. (2500 mg daily) 1000 mg  am  500 mg lunch 1000 mg pm, Disp: , Rfl:    metoprolol  succinate (TOPROL -XL) 50 MG 24 hr tablet, Take 1 tablet (50 mg total) by mouth daily. Take with or immediately following a meal., Disp: 90 tablet, Rfl: 3   Multiple Vitamins-Minerals (MULTIVITAMIN WITH MINERALS) tablet, Take 1 tablet by mouth daily., Disp: , Rfl:    potassium chloride  SA (KLOR-CON  M) 20 MEQ tablet, Take 1 tablet (20 mEq total) by mouth 2 (two) times daily for 3 days., Disp: 6 tablet, Rfl: 0   simvastatin (ZOCOR) 40 MG tablet, , Disp: , Rfl:    vitamin k 100 MCG tablet, Take 100 mcg by mouth daily., Disp: , Rfl:     Physical Exam: Blood pressure (!) 162/88, pulse (!) 58, resp. rate 16, height 5' 11 (1.803 m), SpO2 98%.   Affect appropriate Healthy:  appears stated age HEENT: normal Neck supple with no adenopathy JVP normal no bruits no thyromegaly Lungs clear with no wheezing and good diaphragmatic motion Heart:  S1/S2 no murmur, no rub, gallop or click PMI normal Abdomen: benighn, BS positve, no tenderness, no AAA no bruit.  No HSM or HJR Distal pulses intact with no bruits No edema Neuro non-focal Skin warm and dry No muscular weakness   Labs:   Lab Results  Component Value Date   WBC 11.7 (H) 03/27/2024   HGB 13.2 03/27/2024   HCT 39.3 03/27/2024   MCV 82.6 03/27/2024   PLT 303 03/27/2024   No results for input(s): NA, K, CL, CO2, BUN,  CREATININE, CALCIUM, PROT, BILITOT, ALKPHOS, ALT, AST, GLUCOSE in the last 168 hours.  Invalid input(s): LABALBU No results found for: CKTOTAL, CKMB, CKMBINDEX, TROPONINI No results found for: CHOL No results found for: HDL No results found for: [facility] Lab Results  Component Value  Date   TRIG 210 (H) 12/04/2019   No results found for: CHOLHDL No results found for: LDLDIRECT    Radiology: No results found.  EKG:  09/25/22 SR PAC nonspecific ST changes    ASSESSMENT AND PLAN:   Palpitations: with negative w/u in past PAC on ECG better on Toprol   HTN: followed by primary. Currently in Exforge , Lasix 3x/week hydrochlorothiazide, toprol  and Zestril HLD:  On Zocor labs with primary DM:  Discussed low carb diet.  Target hemoglobin A1c is 6.5 or less.  Continue current medications. Dyspnea:  normal exam normal myovue 09/2022 EF 56%' OSA:  related to weight gain on CPAP discussed weight loss and Inpiris device   F/U primary regarding HTN time schedule for meds changed Discuss GLP-1 with primary  F/U in a year    Signed: Maude Emmer 07/08/2024, 8:16 AM

## 2024-07-08 ENCOUNTER — Ambulatory Visit: Attending: Cardiovascular Disease | Admitting: Cardiovascular Disease

## 2024-07-08 ENCOUNTER — Encounter: Payer: Self-pay | Admitting: Cardiovascular Disease

## 2024-07-08 VITALS — BP 162/88 | HR 58 | Resp 16 | Ht 71.0 in

## 2024-07-08 DIAGNOSIS — E785 Hyperlipidemia, unspecified: Secondary | ICD-10-CM | POA: Diagnosis not present

## 2024-07-08 DIAGNOSIS — R002 Palpitations: Secondary | ICD-10-CM

## 2024-07-08 DIAGNOSIS — I10 Essential (primary) hypertension: Secondary | ICD-10-CM | POA: Diagnosis not present

## 2024-07-08 MED ORDER — METOPROLOL SUCCINATE ER 100 MG PO TB24: 50.0000 mg | ORAL_TABLET | Freq: Every day | ORAL | 3 refills | Status: DC

## 2024-07-08 MED ORDER — METOPROLOL SUCCINATE ER 100 MG PO TB24: 50.0000 mg | ORAL_TABLET | Freq: Every day | ORAL | 3 refills | Status: AC

## 2024-07-08 NOTE — Patient Instructions (Signed)

## 2024-07-08 NOTE — Addendum Note (Signed)
 Addended by: DRENA MARTINIS, Fayetta Sorenson L on: 07/08/2024 08:52 AM   Modules accepted: Orders
